# Patient Record
Sex: Female | Born: 1975
Health system: Southern US, Community
[De-identification: ages and names within clinical notes are randomized; demographics above are authoritative.]

## PROBLEM LIST (undated history)

## (undated) ENCOUNTER — Emergency Department (HOSPITAL_COMMUNITY): Admission: EM | Payer: Self-pay

## (undated) DIAGNOSIS — E039 Hypothyroidism, unspecified: Secondary | ICD-10-CM

## (undated) DIAGNOSIS — I10 Essential (primary) hypertension: Secondary | ICD-10-CM

## (undated) DIAGNOSIS — E079 Disorder of thyroid, unspecified: Secondary | ICD-10-CM

## (undated) DIAGNOSIS — Z9119 Patient's noncompliance with other medical treatment and regimen: Secondary | ICD-10-CM

## (undated) DIAGNOSIS — I509 Heart failure, unspecified: Secondary | ICD-10-CM

## (undated) HISTORY — DX: Essential (primary) hypertension: I10

## (undated) HISTORY — PX: TUBAL LIGATION: SHX77

## (undated) HISTORY — DX: Patient's noncompliance with other medical treatment and regimen: Z91.19

---

## 2013-03-01 ENCOUNTER — Encounter (HOSPITAL_BASED_OUTPATIENT_CLINIC_OR_DEPARTMENT_OTHER): Payer: Self-pay | Admitting: Emergency Medicine

## 2013-03-01 ENCOUNTER — Emergency Department (HOSPITAL_BASED_OUTPATIENT_CLINIC_OR_DEPARTMENT_OTHER)
Admission: EM | Admit: 2013-03-01 | Discharge: 2013-03-01 | Disposition: A | Discharge status: Home / Self Care | Payer: Self-pay | Attending: Emergency Medicine | Admitting: Emergency Medicine

## 2013-03-01 DIAGNOSIS — Z8639 Personal history of other endocrine, nutritional and metabolic disease: Secondary | ICD-10-CM | POA: Insufficient documentation

## 2013-03-01 DIAGNOSIS — I10 Essential (primary) hypertension: Secondary | ICD-10-CM | POA: Insufficient documentation

## 2013-03-01 DIAGNOSIS — Z862 Personal history of diseases of the blood and blood-forming organs and certain disorders involving the immune mechanism: Secondary | ICD-10-CM | POA: Insufficient documentation

## 2013-03-01 DIAGNOSIS — B86 Scabies: Secondary | ICD-10-CM | POA: Insufficient documentation

## 2013-03-01 HISTORY — DX: Essential (primary) hypertension: I10

## 2013-03-01 HISTORY — DX: Disorder of thyroid, unspecified: E07.9

## 2013-03-01 HISTORY — DX: Hypothyroidism, unspecified: E03.9

## 2013-03-01 MED ORDER — PERMETHRIN 5 % EX CREA: TOPICAL_CREAM | CUTANEOUS | Status: DC

## 2013-03-01 NOTE — ED Provider Notes (Signed)
CSN: [id number]     Arrival date & time 03/01/13  4098 History   First MD Initiated Contact with Patient 03/01/13 0827     Chief Complaint  Patient presents with  . Rash   (Consider location/radiation/quality/duration/timing/severity/associated sxs/prior Treatment) Patient is a 37 y.o. female presenting with rash. The history is provided by the patient.  Rash Location:  Hand, shoulder/arm and leg Shoulder/arm rash location:  L hand Hand rash location:  L hand Leg rash location:  L upper leg Quality: itchiness   Quality: not bruising, not burning, not peeling, not red, not scaling, not swelling and not weeping   Severity:  Mild Onset quality:  Gradual Duration:  3 weeks Timing:  Constant Progression:  Spreading Chronicity:  New Context: sick contacts (had sex with a new partner who had similar symptoms)   Context: not medications and not new detergent/soap   Relieved by:  Nothing Worsened by:  Nothing tried Ineffective treatments:  Antihistamines Associated symptoms: no abdominal pain, no diarrhea, no fever, no nausea, no shortness of breath, no URI and not vomiting     Past Medical History  Diagnosis Date  . Hypertension   . Thyroid disease   . Hypothyroidism    Past Surgical History  Procedure Laterality Date  . Cesarean section      x 2  . Tubal ligation     No family history on file. History  Substance Use Topics  . Smoking status: Never Smoker   . Smokeless tobacco: Never Used  . Alcohol Use: No   OB History   Grav Para Term Preterm Abortions TAB SAB Ect Mult Living                 Review of Systems  Constitutional: Negative for fever.  Respiratory: Negative for shortness of breath.   Gastrointestinal: Negative for nausea, vomiting, abdominal pain and diarrhea.  Skin: Positive for rash.  All other systems reviewed and are negative.    Allergies  Review of patient's allergies indicates no known allergies.  Home Medications   Current Outpatient  Rx  Name  Route  Sig  Dispense  Refill  . permethrin (ELIMITE) 5 % cream      Apply to affected area once   60 g   1    BP 147/85  Temp(Src) 99 F (37.2 C) (Oral)  Resp 20  Ht 5\' 6"  (1.676 m)  Wt 206 lb (93.441 kg)  BMI 33.27 kg/m2  SpO2 100%  LMP 02/13/2013 Physical Exam  Nursing note and vitals reviewed. Constitutional: She is oriented to person, place, and time. She appears well-developed and well-nourished. No distress.  HENT:  Head: Normocephalic and atraumatic.  Mouth/Throat: Oropharynx is clear and moist.  Eyes: EOM are normal. Pupils are equal, round, and reactive to light.  Neck: Normal range of motion. Neck supple.  Cardiovascular: Normal rate and regular rhythm.  Exam reveals no friction rub.   No murmur heard. Pulmonary/Chest: Effort normal and breath sounds normal. No respiratory distress. She has no wheezes. She has no rales.  Abdominal: Soft. She exhibits no distension. There is no tenderness. There is no rebound.  Musculoskeletal: Normal range of motion. She exhibits no edema.  Neurological: She is alert and oriented to person, place, and time.  Skin: Rash (papules on L hand, L chest, upper back, face. 1-2 at each location. No purpura, erythema, cellulitis, weeping, blistering) noted. She is not diaphoretic.    ED Course  Procedures (including critical care time) Labs Review  Labs Reviewed - No data to display Imaging Review No results found.  EKG Interpretation   None       MDM   1. Scabies    37 year old female presents with rash. She's had itching with the rash. The rash is on her left hand. She also has lesions on her chest, upper back, face, left thigh. She is with a partner 3-4 weeks ago which was new and he had similar rash was itching at that time. She denies any fevers, nausea, vomiting, discharge, belly pain. She is very small papules on her left hand underwent . She also has one or 2 tablets in the left chest: Reveals her face, one or 2  papules on her left thigh. There is erythema, purpura, concern for infection. Patient's rash consistent with scabies. Given permethrin cream. Instructed to longer her close and sheets. Discharged.    Dagmar Hait, MD 03/01/13 [phone]

## 2013-03-01 NOTE — ED Notes (Signed)
Patient states she has had a generalized body rash for the last three weeks.  States she was told by someone this morning she had chicken pox.  States the rash starts as an itch and migrates up.

## 2017-07-28 ENCOUNTER — Emergency Department (HOSPITAL_BASED_OUTPATIENT_CLINIC_OR_DEPARTMENT_OTHER): Payer: Self-pay

## 2017-07-28 ENCOUNTER — Inpatient Hospital Stay (HOSPITAL_BASED_OUTPATIENT_CLINIC_OR_DEPARTMENT_OTHER)
Admission: EM | Admit: 2017-07-28 | Discharge: 2017-07-30 | DRG: 305 | Disposition: A | Discharge status: Home / Self Care | Payer: Self-pay | Attending: Internal Medicine | Admitting: Internal Medicine

## 2017-07-28 ENCOUNTER — Other Ambulatory Visit: Payer: Self-pay

## 2017-07-28 ENCOUNTER — Encounter (HOSPITAL_BASED_OUTPATIENT_CLINIC_OR_DEPARTMENT_OTHER): Payer: Self-pay | Admitting: *Deleted

## 2017-07-28 DIAGNOSIS — R030 Elevated blood-pressure reading, without diagnosis of hypertension: Secondary | ICD-10-CM

## 2017-07-28 DIAGNOSIS — R0602 Shortness of breath: Secondary | ICD-10-CM

## 2017-07-28 DIAGNOSIS — I110 Hypertensive heart disease with heart failure: Secondary | ICD-10-CM | POA: Diagnosis present

## 2017-07-28 DIAGNOSIS — G4700 Insomnia, unspecified: Secondary | ICD-10-CM | POA: Diagnosis present

## 2017-07-28 DIAGNOSIS — D649 Anemia, unspecified: Secondary | ICD-10-CM

## 2017-07-28 DIAGNOSIS — R7989 Other specified abnormal findings of blood chemistry: Secondary | ICD-10-CM

## 2017-07-28 DIAGNOSIS — K5900 Constipation, unspecified: Secondary | ICD-10-CM | POA: Diagnosis present

## 2017-07-28 DIAGNOSIS — G44209 Tension-type headache, unspecified, not intractable: Secondary | ICD-10-CM | POA: Diagnosis present

## 2017-07-28 DIAGNOSIS — D509 Iron deficiency anemia, unspecified: Secondary | ICD-10-CM

## 2017-07-28 DIAGNOSIS — I160 Hypertensive urgency: Principal | ICD-10-CM | POA: Diagnosis present

## 2017-07-28 DIAGNOSIS — E039 Hypothyroidism, unspecified: Secondary | ICD-10-CM

## 2017-07-28 DIAGNOSIS — I5032 Chronic diastolic (congestive) heart failure: Secondary | ICD-10-CM

## 2017-07-28 LAB — CBC
HEMATOCRIT: 27.4 % — AB (ref 36.0–46.0)
Hemoglobin: 7.7 g/dL — ABNORMAL LOW (ref 12.0–15.0)
MCH: 17.1 pg — AB (ref 26.0–34.0)
MCHC: 28.1 g/dL — AB (ref 30.0–36.0)
MCV: 61 fL — AB (ref 78.0–100.0)
Platelets: 187 10*3/uL (ref 150–400)
RBC: 4.49 MIL/uL (ref 3.87–5.11)
RDW: 21.3 % — AB (ref 11.5–15.5)
WBC: 5.5 10*3/uL (ref 4.0–10.5)

## 2017-07-28 LAB — COMPREHENSIVE METABOLIC PANEL
ALBUMIN: 4.2 g/dL (ref 3.5–5.0)
ALK PHOS: 111 U/L (ref 38–126)
ALT: 12 U/L — ABNORMAL LOW (ref 14–54)
AST: 17 U/L (ref 15–41)
Anion gap: 11 (ref 5–15)
BUN: 13 mg/dL (ref 6–20)
CALCIUM: 8.8 mg/dL — AB (ref 8.9–10.3)
CO2: 23 mmol/L (ref 22–32)
Chloride: 102 mmol/L (ref 101–111)
Creatinine, Ser: 0.6 mg/dL (ref 0.44–1.00)
GFR calc Af Amer: >60 mL/min (ref >60)
GFR calc non Af Amer: >60 mL/min (ref >60)
Glucose, Bld: 99 mg/dL (ref 65–99)
POTASSIUM: 3.5 mmol/L (ref 3.5–5.1)
Sodium: 136 mmol/L (ref 135–145)
Total Bilirubin: 0.5 mg/dL (ref 0.3–1.2)
Total Protein: 7.8 g/dL (ref 6.5–8.1)

## 2017-07-28 LAB — CBC WITH DIFFERENTIAL/PLATELET
Basophils Absolute: 0 10*3/uL (ref 0.0–0.1)
Basophils Relative: 0 %
EOS PCT: 3 %
Eosinophils Absolute: 0.1 10*3/uL (ref 0.0–0.7)
HCT: 28.8 % — ABNORMAL LOW (ref 36.0–46.0)
HEMOGLOBIN: 8.1 g/dL — AB (ref 12.0–15.0)
Lymphocytes Relative: 28 %
Lymphs Abs: 1.2 10*3/uL (ref 0.7–4.0)
MCH: 17.2 pg — ABNORMAL LOW (ref 26.0–34.0)
MCHC: 28.1 g/dL — ABNORMAL LOW (ref 30.0–36.0)
MCV: 61 fL — AB (ref 78.0–100.0)
Monocytes Absolute: 0.4 10*3/uL (ref 0.1–1.0)
Monocytes Relative: 9 %
NEUTROS PCT: 60 %
Neutro Abs: 2.7 10*3/uL (ref 1.7–7.7)
Platelets: 178 10*3/uL (ref 150–400)
RBC: 4.72 MIL/uL (ref 3.87–5.11)
RDW: 21 % — ABNORMAL HIGH (ref 11.5–15.5)
WBC: 4.4 10*3/uL (ref 4.0–10.5)

## 2017-07-28 LAB — URINALYSIS, ROUTINE W REFLEX MICROSCOPIC
BILIRUBIN URINE: NEGATIVE
Glucose, UA: NEGATIVE mg/dL
Hgb urine dipstick: NEGATIVE
Ketones, ur: NEGATIVE mg/dL
Leukocytes, UA: NEGATIVE
NITRITE: NEGATIVE
PH: 6 (ref 5.0–8.0)
Protein, ur: NEGATIVE mg/dL
SPECIFIC GRAVITY, URINE: 1.02 (ref 1.005–1.030)

## 2017-07-28 LAB — LIPASE, BLOOD: Lipase: 23 U/L (ref 11–51)

## 2017-07-28 LAB — CREATININE, SERUM
Creatinine, Ser: 0.65 mg/dL (ref 0.44–1.00)
GFR calc Af Amer: >60 mL/min (ref >60)
GFR calc non Af Amer: >60 mL/min (ref >60)

## 2017-07-28 LAB — OCCULT BLOOD X 1 CARD TO LAB, STOOL: Fecal Occult Bld: NEGATIVE

## 2017-07-28 LAB — TROPONIN I: Troponin I: <0.03 ng/mL (ref <0.03)

## 2017-07-28 LAB — BRAIN NATRIURETIC PEPTIDE: B Natriuretic Peptide: 238.5 pg/mL — ABNORMAL HIGH (ref 0.0–100.0)

## 2017-07-28 MED ORDER — IRBESARTAN 75 MG PO TABS
75.0000 mg | ORAL_TABLET | Freq: Every day | ORAL | Status: DC
  Administered 2017-07-30: 75 mg via ORAL
  Filled 2017-07-28 (×2): qty 1

## 2017-07-28 MED ORDER — LABETALOL HCL 5 MG/ML IV SOLN
10.0000 mg | Freq: Once | INTRAVENOUS | Status: AC
  Administered 2017-07-28: 10 mg via INTRAVENOUS
  Filled 2017-07-28: qty 4

## 2017-07-28 MED ORDER — SODIUM CHLORIDE 0.9 % IV SOLN: 250.0000 mL | INTRAVENOUS | Status: DC | PRN

## 2017-07-28 MED ORDER — CARVEDILOL 6.25 MG PO TABS
6.2500 mg | ORAL_TABLET | Freq: Two times a day (BID) | ORAL | Status: DC
  Administered 2017-07-29 – 2017-07-30 (×2): 6.25 mg via ORAL
  Filled 2017-07-28 (×3): qty 1

## 2017-07-28 MED ORDER — SODIUM CHLORIDE 0.9% FLUSH
3.0000 mL | Freq: Two times a day (BID) | INTRAVENOUS | Status: DC
  Administered 2017-07-29 – 2017-07-30 (×2): 3 mL via INTRAVENOUS
  Filled 2017-07-28: qty 3

## 2017-07-28 MED ORDER — ZOLPIDEM TARTRATE 5 MG PO TABS
5.0000 mg | ORAL_TABLET | Freq: Every evening | ORAL | Status: DC | PRN
  Filled 2017-07-28: qty 1

## 2017-07-28 MED ORDER — IPRATROPIUM-ALBUTEROL 0.5-2.5 (3) MG/3ML IN SOLN
3.0000 mL | Freq: Four times a day (QID) | RESPIRATORY_TRACT | Status: DC
  Administered 2017-07-28 (×2): 3 mL via RESPIRATORY_TRACT
  Filled 2017-07-28 (×2): qty 3

## 2017-07-28 MED ORDER — ACETAMINOPHEN 325 MG PO TABS
650.0000 mg | ORAL_TABLET | ORAL | Status: DC | PRN
  Administered 2017-07-29 – 2017-07-30 (×3): 650 mg via ORAL
  Filled 2017-07-28 (×3): qty 2

## 2017-07-28 MED ORDER — ONDANSETRON HCL 4 MG/2ML IJ SOLN
4.0000 mg | Freq: Four times a day (QID) | INTRAMUSCULAR | Status: DC | PRN
  Administered 2017-07-29: 4 mg via INTRAVENOUS
  Filled 2017-07-28: qty 2

## 2017-07-28 MED ORDER — ENOXAPARIN SODIUM 40 MG/0.4ML ~~LOC~~ SOLN
40.0000 mg | SUBCUTANEOUS | Status: DC
  Administered 2017-07-29: 40 mg via SUBCUTANEOUS
  Filled 2017-07-28: qty 0.4

## 2017-07-28 MED ORDER — SODIUM CHLORIDE 0.9% FLUSH
3.0000 mL | INTRAVENOUS | Status: DC | PRN
  Filled 2017-07-28: qty 3

## 2017-07-28 NOTE — ED Triage Notes (Signed)
Cough that is worse at night and fever x 9 days. She feels like she has the flu.

## 2017-07-28 NOTE — Care Management (Addendum)
42 year old female [facility]Dr. Rush Landmarkegeler) regarding possible new congestive heart failure.  Patient presented there complaining of the right upper respiratory infection shortness of breath and a cough for about a week.  She has a long history of heavy menstrual bleeding but has not had a.  For the past 10 days.  At presentation her blood pressure was 230/110 she was given some labetalol.  She has no prior history of congestive heart failure but a chest x-ray shows chronic cardiomegaly.  She has some mild edema peripherally on examination.  On presentation she was also wheezing and was treated for that with nebulizers and improved.  Review of care everywhere reveals that in 2008 she received 2 units of packed red blood cells after delivery of a baby.  At that point her hemoglobin was 8.1.  However her MCV was 79.  Today her MCV is 61.  Her blood pressure is markedly elevated and out of control.  She will be admitted for hypertensive emergency with congestive heart failure.  Patient reports that she has never been on any medications but review of records in care everywhere reveals that she was prescribed hydrochlorothiazide on January 30, 2016. Blood pressures at ER visits in October 26, 2016 (213/115), January 30, 2016 (190/91), and September 22, 2014 (184/97) all revealed markedly elevated blood pressures as noted in parentheses.  Concerning issues for this patient are:  1. hypertensive emergency with acute congestive heart failure 2.  Anemia with hemoglobin of 8.1 and MCV of 61 3.  Bronchospasm responsive to nebulizers in a non-smoker.  She will be admitted to our facility. Initial holding admission orders are written.

## 2017-07-28 NOTE — ED Provider Notes (Signed)
[facility] EMERGENCY DEPARTMENT Provider Note   CSN: [id number] Arrival date & time: 07/28/17  1532     History   Chief Complaint Chief Complaint  Patient presents with  . Cough    HPI Andrea Barrett is a 42 y.o. female.  The history is provided by the patient and medical records. No language interpreter was used.  Cough  This is a new problem. The current episode started more than 1 week ago. The problem occurs constantly. The problem has been gradually worsening. The cough is productive of sputum. Maximum temperature: subjective. Associated symptoms include chest pain, chills, headaches (mild), rhinorrhea, myalgias, shortness of breath and wheezing. Pertinent negatives include no sweats and no sore throat. She has tried nothing for the symptoms. The treatment provided no relief. She is not a smoker.    Past Medical History:  Diagnosis Date  . Hypertension   . Hypothyroidism   . Thyroid disease     There are no active problems to display for this patient.   Past Surgical History:  Procedure Laterality Date  . CESAREAN SECTION     x 2  . TUBAL LIGATION       OB History   None      Home Medications    Prior to Admission medications   Medication Sig Start Date End Date Taking? Authorizing Provider  permethrin (ELIMITE) 5 % cream Apply to affected area once 03/01/13   Elwin Mocha, MD    Family History No family history on file.  Social History Social History   Tobacco Use  . Smoking status: Never Smoker  . Smokeless tobacco: Never Used  Substance Use Topics  . Alcohol use: No  . Drug use: No     Allergies   Patient has no known allergies.   Review of Systems Review of Systems  Constitutional: Positive for chills, fatigue and fever. Negative for diaphoresis and unexpected weight change.  HENT: Positive for congestion and rhinorrhea. Negative for sore throat.   Eyes: Negative for visual disturbance.  Respiratory: Positive for  cough, chest tightness, shortness of breath and wheezing. Negative for choking and stridor.   Cardiovascular: Positive for chest pain. Negative for palpitations and leg swelling.  Gastrointestinal: Positive for constipation, nausea and vomiting. Negative for abdominal pain and diarrhea.  Genitourinary: Positive for frequency. Negative for dysuria.  Musculoskeletal: Positive for myalgias. Negative for back pain, neck pain and neck stiffness.  Skin: Negative for rash and wound.  Neurological: Positive for headaches (mild). Negative for light-headedness.  Psychiatric/Behavioral: Negative for agitation.  All other systems reviewed and are negative.    Physical Exam Updated Vital Signs BP (!) 221/116 Comment: only takes her BP medication when she feels her BP is elevated  Pulse 91   Temp 98.3 F (36.8 C) (Oral)   Resp 20   Ht 5\' 6"  (1.676 m)   Wt 117 kg (257 lb 15 oz)   LMP 07/21/2017   SpO2 99%   BMI 41.63 kg/m   Physical Exam  Constitutional: She is oriented to person, place, and time. She appears well-developed and well-nourished. No distress.  HENT:  Head: Normocephalic and atraumatic.  Nose: Nose normal.  Mouth/Throat: Oropharynx is clear and moist. No oropharyngeal exudate.  Eyes: Pupils are equal, round, and reactive to light. Conjunctivae are normal.  Neck: Normal range of motion. Neck supple.  Cardiovascular: Normal rate and intact distal pulses.  No murmur heard. Pulmonary/Chest: Effort normal. No respiratory distress. She has wheezes. She exhibits  no tenderness.  Abdominal: Soft. Bowel sounds are normal. She exhibits no distension. There is no tenderness.  Musculoskeletal: Normal range of motion. She exhibits no edema or tenderness.  Lymphadenopathy:    She has no cervical adenopathy.  Neurological: She is alert and oriented to person, place, and time. No cranial nerve deficit or sensory deficit. She exhibits normal muscle tone.  Skin: Skin is warm. Capillary refill  takes less than 2 seconds. No rash noted. She is not diaphoretic. No erythema.  Psychiatric: She has a normal mood and affect.  Nursing note and vitals reviewed.    ED Treatments / Results  Labs (all labs ordered are listed, but only abnormal results are displayed) Labs Reviewed  COMPREHENSIVE METABOLIC PANEL - Abnormal; Notable for the following components:      Result Value   Calcium 8.8 (*)    ALT 12 (*)    All other components within normal limits  BRAIN NATRIURETIC PEPTIDE - Abnormal; Notable for the following components:   B Natriuretic Peptide 238.5 (*)    All other components within normal limits  CBC WITH DIFFERENTIAL/PLATELET - Abnormal; Notable for the following components:   Hemoglobin 8.1 (*)    HCT 28.8 (*)    MCV 61.0 (*)    MCH 17.2 (*)    MCHC 28.1 (*)    RDW 21.0 (*)    All other components within normal limits  URINE CULTURE  LIPASE, BLOOD  TROPONIN I  URINALYSIS, ROUTINE W REFLEX MICROSCOPIC  OCCULT BLOOD X 1 CARD TO LAB, STOOL  CREATININE, SERUM  CBC WITH DIFFERENTIAL/PLATELET  HIV ANTIBODY (ROUTINE TESTING)  BASIC METABOLIC PANEL  CBC  TSH    EKG EKG Interpretation  Date/Time:  Tuesday July 28 2017 16:29:23 EDT Ventricular Rate:  67 PR Interval:    QRS Duration: 98 QT Interval:  446 QTC Calculation: 471 R Axis:   63 Text Interpretation:  Sinus rhythm Probable left ventricular hypertrophy No prior ECG for comparison.  No STEMI Confirmed by Theda Belfastegeler, Chris ([id number]) on 07/28/2017 4:43:59 PM   Radiology Dg Chest 2 View  Result Date: 07/28/2017 CLINICAL DATA:  Productive cough with chills and malaise EXAM: CHEST - 2 VIEW COMPARISON:  01/26/2007 FINDINGS: Chronic cardiomegaly. Chronic vascular prominence. Artifact from EKG leads. There is no edema, consolidation, effusion, or pneumothorax. No acute osseous finding. IMPRESSION: 1. No evidence of active disease. 2. Chronic cardiomegaly. Electronically Signed   By: Marnee SpringJonathon  Watts M.D.   On:  07/28/2017 17:03    Procedures Procedures (including critical care time)  Medications Ordered in ED Medications  sodium chloride flush (NS) 0.9 % injection 3 mL (has no administration in time range)  sodium chloride flush (NS) 0.9 % injection 3 mL (has no administration in time range)  0.9 %  sodium chloride infusion (has no administration in time range)  acetaminophen (TYLENOL) tablet 650 mg (has no administration in time range)  ondansetron (ZOFRAN) injection 4 mg (has no administration in time range)  enoxaparin (LOVENOX) injection 40 mg (has no administration in time range)  irbesartan (AVAPRO) tablet 75 mg (has no administration in time range)  carvedilol (COREG) tablet 6.25 mg (has no administration in time range)  zolpidem (AMBIEN) tablet 5 mg (has no administration in time range)  labetalol (NORMODYNE,TRANDATE) injection 10 mg (10 mg Intravenous Given 07/28/17 2014)     Initial Impression / Assessment and Plan / ED Course  I have reviewed the triage vital signs and the nursing notes.  Pertinent labs & imaging  results that were available during my care of the patient were reviewed by me and considered in my medical decision making (see chart for details).    EMMI WERTHEIM is a 42 y.o. female with a past medical history significant for diabetes, hyperthyroidism, and on treated hypertension who presents with fevers, chills, congestion, cough, chest tightness, malaise, fatigue, urinary frequency, nausea, and chest tightness.  Patient reports that her symptoms of been ongoing for the last 9 days.  She reports over the last week she has had the cough persistence.  She reports having an initially green and yellow cough that has become nonproductive over the last few days.  She denies hemoptysis.  She reports nausea but no vomiting.  She reports urinary frequency but denies dysuria.  She denies any abdominal pain.  She reports chills on and off.  She denies any history of DVT, PE, or a  family history of cardiac disease.  She reports that she has had some tingling in all of her extremities.  She reports that she has had some shortness of breath with her coughing fits and is also had the sharp chest discomfort when she is coughing.  She is unsure of sick contacts.  On exam, patient had wheezing in her lungs.  Chest was nontender.  Back was nontender.  No CVA tenderness.  Abdomen nontender.  Patient had normal sensation and strength in all extremities.  Normal pulses in all extremities.  Very mild edema seen in the lower extremities.  Based on her description of symptoms I am concerned about a viral versus bacterial infection contributing to her symptoms.  Patient was found to be very albuterol hypertensive in triage, will place the patient on monitoring to determine if she is persistently hypertensive.  If so, may consider an hypertensive medications.  Given the patient's wheezing, she also be given a breathing treatment.  If this improves and she feels better, will consider prescription.  Anticipate reassessment after diagnostic laboratory testing.  As patient no focal neurologic deficits, do not feel the patient has an intracranial hemorrhage or other abnormality in the setting of her elevated blood pressure.  8:02 PM On reassessment, patient had improvement after breathing treatment.  Patient continues to feel slightly short of breath.  Wheezing is resolved.  Patient's blood pressure initially improved into the 190s but then was back into the 210 range.  Laboratory testing revealed several areas of concern.  Patient was found to have elevated BNP.  There is no prior to compare.  Patient also had stable cardiomegaly on chest x-ray.  This raises concern for new heart failure.  Patient also found to have anemia with hemoglobin of 8.1.  Patient says that she has had a history of heavy menstrual cycles and finished her menstrual cycle 10 days ago.  She reports no recent vaginal bleeding  since then.  She denies any rectal bleeding.  Fecal occult was negative.  Metabolic panel was reassuring and lipase not elevated.  Urinalysis showed no UTI.  Chest x-ray shows no pneumonia.  Patient was given labetalol to try and improve her elevated blood pressure.  Given the patient's continued shortness of breath, new anemia with unclear etiology, concern for elevated BNP with no prior history of CHF, and her severe blood pressure elevation, do not feel patient is safe for discharge home.  Patient will be admitted for further management of her elevated blood pressure, monitoring of her anemia, and likely initiation of diuresis.  Patient will likely need ultrasound to  look for evidence of heart failure.  Hospitalist team will be called for admission.     Final Clinical Impressions(s) / ED Diagnoses   Final diagnoses:  Shortness of breath  Elevated brain natriuretic peptide (BNP) level  Anemia, unspecified type  Elevated blood pressure reading    ED Discharge Orders    None      Clinical Impression: 1. Shortness of breath   2. Elevated brain natriuretic peptide (BNP) level   3. Anemia, unspecified type   4. Elevated blood pressure reading     Disposition: Admit  This note was prepared with assistance of Dragon voice recognition software. Occasional wrong-word or sound-a-like substitutions may have occurred due to the inherent limitations of voice recognition software.     Tegeler, Canary Brim, MD 07/28/17 [phone]

## 2017-07-29 ENCOUNTER — Inpatient Hospital Stay (HOSPITAL_BASED_OUTPATIENT_CLINIC_OR_DEPARTMENT_OTHER): Payer: Self-pay

## 2017-07-29 ENCOUNTER — Encounter (HOSPITAL_COMMUNITY): Payer: Self-pay | Admitting: Family Medicine

## 2017-07-29 DIAGNOSIS — I110 Hypertensive heart disease with heart failure: Secondary | ICD-10-CM

## 2017-07-29 DIAGNOSIS — I517 Cardiomegaly: Secondary | ICD-10-CM

## 2017-07-29 LAB — CBC
HCT: 28.9 % — ABNORMAL LOW (ref 36.0–46.0)
Hemoglobin: 7.6 g/dL — ABNORMAL LOW (ref 12.0–15.0)
MCH: 16.6 pg — AB (ref 26.0–34.0)
MCHC: 26.3 g/dL — AB (ref 30.0–36.0)
MCV: 63 fL — AB (ref 78.0–100.0)
Platelets: 167 10*3/uL (ref 150–400)
RBC: 4.59 MIL/uL (ref 3.87–5.11)
RDW: 20 % — AB (ref 11.5–15.5)
WBC: 4.6 10*3/uL (ref 4.0–10.5)

## 2017-07-29 LAB — IRON AND TIBC
Iron: 18 ug/dL — ABNORMAL LOW (ref 28–170)
SATURATION RATIOS: 4 % — AB (ref 10.4–31.8)
TIBC: 482 ug/dL — AB (ref 250–450)
UIBC: 464 ug/dL

## 2017-07-29 LAB — RETICULOCYTES
RBC.: 4.59 MIL/uL (ref 3.87–5.11)
RETIC CT PCT: 1.5 % (ref 0.4–3.1)
Retic Count, Absolute: 68.9 10*3/uL (ref 19.0–186.0)

## 2017-07-29 LAB — CREATININE, SERUM
CREATININE: 0.65 mg/dL (ref 0.44–1.00)
GFR calc Af Amer: >60 mL/min (ref >60)

## 2017-07-29 LAB — FOLATE: Folate: 11.4 ng/mL (ref >5.9)

## 2017-07-29 LAB — ECHOCARDIOGRAM COMPLETE
HEIGHTINCHES: 66 in
WEIGHTICAEL: 4127.01 [oz_av]

## 2017-07-29 LAB — FERRITIN: FERRITIN: 5 ng/mL — AB (ref 11–307)

## 2017-07-29 LAB — TSH: TSH: 4.717 u[IU]/mL — AB (ref 0.350–4.500)

## 2017-07-29 LAB — VITAMIN B12: VITAMIN B 12: 401 pg/mL (ref 180–914)

## 2017-07-29 LAB — TROPONIN I: Troponin I: <0.03 ng/mL (ref <0.03)

## 2017-07-29 MED ORDER — LABETALOL HCL 5 MG/ML IV SOLN
5.0000 mg | Freq: Once | INTRAVENOUS | Status: AC
  Administered 2017-07-29: 5 mg via INTRAVENOUS
  Filled 2017-07-29: qty 4

## 2017-07-29 MED ORDER — SODIUM CHLORIDE 0.9% FLUSH
3.0000 mL | Freq: Two times a day (BID) | INTRAVENOUS | Status: DC
  Administered 2017-07-29 – 2017-07-30 (×3): 3 mL via INTRAVENOUS

## 2017-07-29 MED ORDER — ONDANSETRON HCL 4 MG/2ML IJ SOLN: 4.0000 mg | Freq: Four times a day (QID) | INTRAMUSCULAR | Status: DC | PRN

## 2017-07-29 MED ORDER — LISINOPRIL 5 MG PO TABS: 5.0000 mg | ORAL_TABLET | Freq: Every day | ORAL | Status: DC

## 2017-07-29 MED ORDER — SODIUM CHLORIDE 0.9 % IV SOLN: 250.0000 mL | INTRAVENOUS | Status: DC | PRN

## 2017-07-29 MED ORDER — POLYETHYLENE GLYCOL 3350 17 G PO PACK
17.0000 g | PACK | Freq: Every day | ORAL | Status: DC
  Administered 2017-07-29 – 2017-07-30 (×2): 17 g via ORAL
  Filled 2017-07-29 (×2): qty 1

## 2017-07-29 MED ORDER — DOCUSATE SODIUM 100 MG PO CAPS: 100.0000 mg | ORAL_CAPSULE | Freq: Every day | ORAL | Status: DC | PRN

## 2017-07-29 MED ORDER — BENZONATATE 100 MG PO CAPS
100.0000 mg | ORAL_CAPSULE | Freq: Once | ORAL | Status: AC
  Administered 2017-07-29: 100 mg via ORAL
  Filled 2017-07-29: qty 1

## 2017-07-29 MED ORDER — SODIUM CHLORIDE 0.9% FLUSH: 3.0000 mL | INTRAVENOUS | Status: DC | PRN

## 2017-07-29 MED ORDER — TRAMADOL HCL 50 MG PO TABS
50.0000 mg | ORAL_TABLET | Freq: Once | ORAL | Status: AC
Start: 2017-07-30 — End: 2017-07-30
  Administered 2017-07-30: 50 mg via ORAL
  Filled 2017-07-29: qty 1

## 2017-07-29 MED ORDER — HEPARIN SODIUM (PORCINE) 5000 UNIT/ML IJ SOLN: 5000.0000 [IU] | Freq: Three times a day (TID) | INTRAMUSCULAR | Status: DC

## 2017-07-29 MED ORDER — HYDRALAZINE HCL 20 MG/ML IJ SOLN
10.0000 mg | Freq: Three times a day (TID) | INTRAMUSCULAR | Status: DC | PRN
  Administered 2017-07-29: 10 mg via INTRAVENOUS
  Filled 2017-07-29: qty 1

## 2017-07-29 MED ORDER — FUROSEMIDE 10 MG/ML IJ SOLN
20.0000 mg | Freq: Once | INTRAMUSCULAR | Status: AC
  Administered 2017-07-29: 20 mg via INTRAVENOUS
  Filled 2017-07-29: qty 2

## 2017-07-29 MED ORDER — ACETAMINOPHEN 325 MG PO TABS: 650.0000 mg | ORAL_TABLET | ORAL | Status: DC | PRN

## 2017-07-29 MED ORDER — HYDROCOD POLST-CPM POLST ER 10-8 MG/5ML PO SUER
5.0000 mL | Freq: Once | ORAL | Status: AC
Start: 2017-07-29 — End: 2017-07-29
  Administered 2017-07-29: 5 mL via ORAL
  Filled 2017-07-29: qty 5

## 2017-07-29 NOTE — Progress Notes (Signed)
Echocardiogram 2D Echocardiogram has been performed.  Dorothey BasemanReel, Braxton Weisbecker M 07/29/2017, 10:29 AM

## 2017-07-29 NOTE — ED Notes (Signed)
Patient reports relief from coughing after tessalon.

## 2017-07-29 NOTE — Progress Notes (Signed)
Patient does not want type and screen drawn now. Lab tech had just drawn troponin before receiving order for type and screen. RN told lab tech it would be ok to get type and screen with next troponin as patient is refusing lab draw at this time.

## 2017-07-29 NOTE — ED Notes (Signed)
Pt still asleep 

## 2017-07-29 NOTE — H&P (Addendum)
[facility] History and Physical  Andrea Barrett ZOX:[id number] DOB: 04/16/1975 DOA: 07/28/2017  Referring physician:  PCP: Patient, No Pcp Per   Chief Complaint: cough  HPI: Andrea Barrett is a 42 y.o. female with past medical history significant for hypertension, hypothyroid presents with cough.  Patient states that she had a cough for over 7 days.  No chest pain or shortness of breath.  She states that she did not try any over-the-counter medications.  Denies any sick contacts.  No history of heart failure.  No history of heart attack.  ED course: Patient found to be very hypertensive.  Also felt to have some mild interstitial edema on chest x-ray.  Given breathing treatments.  Hospitalist to admit.   Review of Systems:  As per HPI otherwise 10 point review of systems negative.    Past Medical History:  Diagnosis Date  . Hypertension   . Hypothyroidism   . Thyroid disease    Past Surgical History:  Procedure Laterality Date  . CESAREAN SECTION     x 2  . TUBAL LIGATION     Social History:  reports that she has never smoked. She has never used smokeless tobacco. She reports that she does not drink alcohol or use drugs.  No Known Allergies  No family history on file.   Prior to Admission medications   Medication Sig Start Date End Date Taking? Authorizing Provider  permethrin (ELIMITE) 5 % cream Apply to affected area once 03/01/13   Elwin MochaWalden, Blair, MD   Physical Exam: Vitals:   07/29/17 1128 07/29/17 1130 07/29/17 1145 07/29/17 1259  BP: (!) 187/91 (!) 186/100 (!) 195/90 (!) 217/103  Pulse: 69 60  67  Resp:  16 18 18   Temp:    98.6 F (37 C)  TempSrc:    Oral  SpO2:  97% 99% 98%  Weight:    115.2 kg (253 lb 14.4 oz)  Height:    5\' 6"  (1.676 m)    Wt Readings from Last 3 Encounters:  07/29/17 115.2 kg (253 lb 14.4 oz)  03/01/13 93.4 kg (206 lb)    General:  Appears calm and comfortable; A&Ox3 Eyes:  PERRL, EOMI, normal lids, iris ENT:  grossly  normal hearing, lips & tongue Neck:  no LAD, masses or thyromegaly Cardiovascular:  RRR, no m/r/g. No LE edema.  Respiratory:  CTA bilaterally, no w/r/r. Normal respiratory effort. Abdomen:  soft, ntnd Skin:  no rash or induration seen on limited exam Musculoskeletal:  grossly normal tone BUE/BLE Psychiatric:  grossly normal mood and affect, speech fluent and appropriate Neurologic:  CN 2-12 grossly intact, moves all extremities in coordinated fashion.          Labs on Admission:  Basic Metabolic Panel: Recent Labs  Lab 07/28/17 1619 07/28/17 2244  NA 136  --   K 3.5  --   CL 102  --   CO2 23  --   GLUCOSE 99  --   BUN 13  --   CREATININE 0.60 0.65  CALCIUM 8.8*  --    Liver Function Tests: Recent Labs  Lab 07/28/17 1619  AST 17  ALT 12*  ALKPHOS 111  BILITOT 0.5  PROT 7.8  ALBUMIN 4.2   Recent Labs  Lab 07/28/17 1619  LIPASE 23   No results for input(s): AMMONIA in the last 168 hours. CBC: Recent Labs  Lab 07/28/17 1648 07/28/17 2244  WBC 4.4 5.5  NEUTROABS 2.7  --   HGB 8.1*  7.7*  HCT 28.8* 27.4*  MCV 61.0* 61.0*  PLT 178 187   Cardiac Enzymes: Recent Labs  Lab 07/28/17 1619  TROPONINI <0.03    BNP (last 3 results) Recent Labs    07/28/17 1648  BNP 238.5*    ProBNP (last 3 results) No results for input(s): PROBNP in the last 8760 hours.   Serum creatinine: 0.65 mg/dL 16/10/96 0454 Estimated creatinine clearance: 119.4 mL/min  CBG: No results for input(s): GLUCAP in the last 168 hours.  Radiological Exams on Admission: Dg Chest 2 View  Result Date: 07/28/2017 CLINICAL DATA:  Productive cough with chills and malaise EXAM: CHEST - 2 VIEW COMPARISON:  01/26/2007 FINDINGS: Chronic cardiomegaly. Chronic vascular prominence. Artifact from EKG leads. There is no edema, consolidation, effusion, or pneumothorax. No acute osseous finding. IMPRESSION: 1. No evidence of active disease. 2. Chronic cardiomegaly. Electronically Signed   By:  Marnee Spring M.D.   On: 07/28/2017 17:03    EKG: Independently reviewed. NSR. No stemi.  Assessment/Plan Active Problems:   Hypertensive cardiomegaly with heart failure (HCC)   Accelerated HTN w/ Acute heart failure Serial troponin ordered, 1st neg Diuresis x1 Fluid seen on CXR Echo complete Ins and outs Continuous pulse ox D/c in AM Prn hydralazine Continue Coreg, Avapro; new meds started before transfer  Constipation Continue MiraLAX  Insomnia As needed Ambien  Low thyroid TSH mild elevation, f.u as OP  Code Status: FC  DVT Prophylaxis: lovenox Family Communication: noen available Disposition Plan: Pending Improvement, likely discharge tomorrow  Status: inpt tele  Haydee Salter, MD Family Medicine [facility] [email or web address] Password [email or web address]

## 2017-07-30 DIAGNOSIS — I5032 Chronic diastolic (congestive) heart failure: Secondary | ICD-10-CM

## 2017-07-30 DIAGNOSIS — D509 Iron deficiency anemia, unspecified: Secondary | ICD-10-CM

## 2017-07-30 DIAGNOSIS — I160 Hypertensive urgency: Principal | ICD-10-CM

## 2017-07-30 DIAGNOSIS — E039 Hypothyroidism, unspecified: Secondary | ICD-10-CM

## 2017-07-30 LAB — URINE CULTURE: CULTURE: NO GROWTH

## 2017-07-30 LAB — BASIC METABOLIC PANEL
Anion gap: 8 (ref 5–15)
BUN: 9 mg/dL (ref 6–20)
CALCIUM: 8.9 mg/dL (ref 8.9–10.3)
CHLORIDE: 103 mmol/L (ref 101–111)
CO2: 25 mmol/L (ref 22–32)
CREATININE: 0.57 mg/dL (ref 0.44–1.00)
GFR calc non Af Amer: >60 mL/min (ref >60)
Glucose, Bld: 107 mg/dL — ABNORMAL HIGH (ref 65–99)
Potassium: 3.9 mmol/L (ref 3.5–5.1)
SODIUM: 136 mmol/L (ref 135–145)

## 2017-07-30 LAB — TYPE AND SCREEN
ABO/RH(D): O POS
ANTIBODY SCREEN: NEGATIVE

## 2017-07-30 LAB — HIV ANTIBODY (ROUTINE TESTING W REFLEX): HIV Screen 4th Generation wRfx: NONREACTIVE

## 2017-07-30 LAB — GLUCOSE, CAPILLARY: GLUCOSE-CAPILLARY: 121 mg/dL — AB (ref 65–99)

## 2017-07-30 MED ORDER — GUAIFENESIN 100 MG/5ML PO SOLN
5.0000 mL | ORAL | Status: DC | PRN
Start: 2017-07-30 — End: 2017-07-30
  Administered 2017-07-30 (×2): 100 mg via ORAL
  Filled 2017-07-30 (×2): qty 5

## 2017-07-30 MED ORDER — FERROUS SULFATE 325 (65 FE) MG PO TABS: 325.0000 mg | ORAL_TABLET | Freq: Two times a day (BID) | ORAL | 0 refills | Status: DC

## 2017-07-30 MED ORDER — IRBESARTAN 75 MG PO TABS: 75.0000 mg | ORAL_TABLET | Freq: Every day | ORAL | 0 refills | Status: DC

## 2017-07-30 MED ORDER — CARVEDILOL 6.25 MG PO TABS: 6.2500 mg | ORAL_TABLET | Freq: Two times a day (BID) | ORAL | 0 refills | Status: DC

## 2017-07-30 MED ORDER — HYDROCHLOROTHIAZIDE 12.5 MG PO CAPS: 12.5000 mg | ORAL_CAPSULE | Freq: Every day | ORAL | 0 refills | Status: DC

## 2017-07-30 MED ORDER — DOCUSATE SODIUM 100 MG PO CAPS: 100.0000 mg | ORAL_CAPSULE | Freq: Every day | ORAL | 0 refills | Status: DC

## 2017-07-30 MED ORDER — FERUMOXYTOL INJECTION 510 MG/17 ML
510.0000 mg | Freq: Once | INTRAVENOUS | Status: AC
  Administered 2017-07-30: 510 mg via INTRAVENOUS
  Filled 2017-07-30: qty 17

## 2017-07-30 NOTE — Progress Notes (Signed)
Patient refused AM weight. Staff educated patient on reason for daily weight. Patients verbalized understanding and refuses a this time. Will pass off to day shift Rn to get patient's weight before breakfast. Will continue to monitor patient.

## 2017-07-30 NOTE — Discharge Instructions (Signed)
Managing Your Hypertension Hypertension is commonly called high blood pressure. This is when the force of your blood pressing against the walls of your arteries is too strong. Arteries are blood vessels that carry blood from your heart throughout your body. Hypertension forces the heart to work harder to pump blood, and may cause the arteries to become narrow or stiff. Having untreated or uncontrolled hypertension can cause heart attack, stroke, kidney disease, and other problems. What are blood pressure readings? A blood pressure reading consists of a higher number over a lower number. Ideally, your blood pressure should be below 120/80. The first ("top") number is called the systolic pressure. It is a measure of the pressure in your arteries as your heart beats. The second ("bottom") number is called the diastolic pressure. It is a measure of the pressure in your arteries as the heart relaxes. What does my blood pressure reading mean? Blood pressure is classified into four stages. Based on your blood pressure reading, your health care provider may use the following stages to determine what type of treatment you need, if any. Systolic pressure and diastolic pressure are measured in a unit called mm Hg. Normal  Systolic pressure: below 120.  Diastolic pressure: below 80. Elevated  Systolic pressure: 120-129.  Diastolic pressure: below 80. Hypertension stage 1  Systolic pressure: 130-139.  Diastolic pressure: 80-89. Hypertension stage 2  Systolic pressure: 140 or above.  Diastolic pressure: 90 or above. What health risks are associated with hypertension? Managing your hypertension is an important responsibility. Uncontrolled hypertension can lead to:  A heart attack.  A stroke.  A weakened blood vessel (aneurysm).  Heart failure.  Kidney damage.  Eye damage.  Metabolic syndrome.  Memory and concentration problems.  What changes can I make to manage my  hypertension? Hypertension can be managed by making lifestyle changes and possibly by taking medicines. Your health care provider will help you make a plan to bring your blood pressure within a normal range. Eating and drinking  Eat a diet that is high in fiber and potassium, and low in salt (sodium), added sugar, and fat. An example eating plan is called the DASH (Dietary Approaches to Stop Hypertension) diet. To eat this way: ? Eat plenty of fresh fruits and vegetables. Try to fill half of your plate at each meal with fruits and vegetables. ? Eat whole grains, such as whole wheat pasta, brown rice, or whole grain bread. Fill about one quarter of your plate with whole grains. ? Eat low-fat diary products. ? Avoid fatty cuts of meat, processed or cured meats, and poultry with skin. Fill about one quarter of your plate with lean proteins such as fish, chicken without skin, beans, eggs, and tofu. ? Avoid premade and processed foods. These tend to be higher in sodium, added sugar, and fat.  Reduce your daily sodium intake. Most people with hypertension should eat less than 1,500 mg of sodium a day.  Limit alcohol intake to no more than 1 drink a day for nonpregnant women and 2 drinks a day for men. One drink equals 12 oz of beer, 5 oz of wine, or 1 oz of hard liquor. Lifestyle  Work with your health care provider to maintain a healthy body weight, or to lose weight. Ask what an ideal weight is for you.  Get at least 30 minutes of exercise that causes your heart to beat faster (aerobic exercise) most days of the week. Activities may include walking, swimming, or biking.  Include exercise   to strengthen your muscles (resistance exercise), such as weight lifting, as part of your weekly exercise routine. Try to do these types of exercises for 30 minutes at least 3 days a week.  Do not use any products that contain nicotine or tobacco, such as cigarettes and e-cigarettes. If you need help quitting, ask  your health care provider.  Control any long-term (chronic) conditions you have, such as high cholesterol or diabetes. Monitoring  Monitor your blood pressure at home as told by your health care provider. Your personal target blood pressure may vary depending on your medical conditions, your age, and other factors.  Have your blood pressure checked regularly, as often as told by your health care provider. Working with your health care provider  Review all the medicines you take with your health care provider because there may be side effects or interactions.  Talk with your health care provider about your diet, exercise habits, and other lifestyle factors that may be contributing to hypertension.  Visit your health care provider regularly. Your health care provider can help you create and adjust your plan for managing hypertension. Will I need medicine to control my blood pressure? Your health care provider may prescribe medicine if lifestyle changes are not enough to get your blood pressure under control, and if:  Your systolic blood pressure is 130 or higher.  Your diastolic blood pressure is 80 or higher.  Take medicines only as told by your health care provider. Follow the directions carefully. Blood pressure medicines must be taken as prescribed. The medicine does not work as well when you skip doses. Skipping doses also puts you at risk for problems. Contact a health care provider if:  You think you are having a reaction to medicines you have taken.  You have repeated (recurrent) headaches.  You feel dizzy.  You have swelling in your ankles.  You have trouble with your vision. Get help right away if:  You develop a severe headache or confusion.  You have unusual weakness or numbness, or you feel faint.  You have severe pain in your chest or abdomen.  You vomit repeatedly.  You have trouble breathing. Summary  Hypertension is when the force of blood pumping through  your arteries is too strong. If this condition is not controlled, it may put you at risk for serious complications.  Your personal target blood pressure may vary depending on your medical conditions, your age, and other factors. For most people, a normal blood pressure is less than 120/80.  Hypertension is managed by lifestyle changes, medicines, or both. Lifestyle changes include weight loss, eating a healthy, low-sodium diet, exercising more, and limiting alcohol. This information is not intended to replace advice given to you by your health care provider. Make sure you discuss any questions you have with your health care provider. Document Released: 12/17/2011 Document Revised: 02/20/2016 Document Reviewed: 02/20/2016 Elsevier Interactive Patient Education  2018 Elsevier Inc.  

## 2017-07-30 NOTE — Discharge Summary (Signed)
Physician Discharge Summary  Andrea Barrett ZOX:[id number] DOB: December 08, 1975 DOA: 07/28/2017  PCP: Patient, No Pcp Per  Admit date: 07/28/2017 Discharge date: 07/30/2017  Admitted From: Home Disposition:  Home  Recommendations for Outpatient Follow-up:  1. Follow up with PCP in 1 week. Establish with PCP on discharge.  2. Please obtain CBC in 1 week as well as follow up on iron levels for iron deficiency anemia with negative FOBT, likely due to menstrual cycles.   3. Please obtain BMP in 1 week as patient started on ARB and HCTZ on discharge.  4. Repeat TSH and free T4 as outpatient.   Discharge Condition: Stable CODE STATUS: Full  Diet recommendation: Heart healthy   Brief/Interim Summary: Andrea Barrett is a 42 y.o. female with past medical history significant for hypertension, hypothyroidism who presented with chief complaint of cough.  Patient states that she had a cough for over 7 days.  No chest pain or shortness of breath.  She states that she did not try any over-the-counter medications.  Denies any sick contacts.  No history of heart failure.  No history of heart attack. In the ED, patient was found to be very hypertensive 203/105.  Also felt to have some mild interstitial edema on chest x-ray. She was admitted for further evaluation and treatment of hypertensive urgency. She was given IV lasix 20mg  and echocardiogram completed with results as below. BNP was 238.5 with negative troponin. Blood pressure was monitored and antihypertensives given with improved control. Anemia work up revealed iron deficiency anemia, FOBT negative. Patient admitted to heavy menstrual cycles. She was given IV iron in hospital and given iron supplementation on discharge.   Discharge Diagnoses:  Principal Problem:   Hypertensive urgency Active Problems:   Chronic diastolic CHF (congestive heart failure) (HCC)   Tension headache   Constipation   Iron deficiency anemia   Hypothyroidism   Discharge  Instructions  Discharge Instructions    Call MD for:  difficulty breathing, headache or visual disturbances   Complete by:  As directed    Call MD for:  extreme fatigue   Complete by:  As directed    Call MD for:  hives   Complete by:  As directed    Call MD for:  persistant dizziness or light-headedness   Complete by:  As directed    Call MD for:  persistant nausea and vomiting   Complete by:  As directed    Call MD for:  severe uncontrolled pain   Complete by:  As directed    Call MD for:  temperature >100.4   Complete by:  As directed    Diet - low sodium heart healthy   Complete by:  As directed    Discharge instructions   Complete by:  As directed    You were cared for by a hospitalist during your hospital stay. If you have any questions about your discharge medications or the care you received while you were in the hospital after you are discharged, you can call the unit and asked to speak with the hospitalist on call if the hospitalist that took care of you is not available. Once you are discharged, your primary care physician will handle any further medical issues. Please note that NO REFILLS for any discharge medications will be authorized once you are discharged, as it is imperative that you return to your primary care physician (or establish a relationship with a primary care physician if you do not have one) for your  aftercare needs so that they can reassess your need for medications and monitor your lab values.   Increase activity slowly   Complete by:  As directed      Allergies as of 07/30/2017   No Known Allergies     Medication List    STOP taking these medications   DAYQUIL PO   permethrin 5 % cream Commonly known as:  ELIMITE     TAKE these medications   carvedilol 6.25 MG tablet Commonly known as:  COREG Take 1 tablet (6.25 mg total) by mouth 2 (two) times daily with a meal.   docusate sodium 100 MG capsule Commonly known as:  COLACE Take 1 capsule  (100 mg total) by mouth daily.   ferrous sulfate 325 (65 FE) MG tablet Take 1 tablet (325 mg total) by mouth 2 (two) times daily with a meal.   GOODY HEADACHE PO Take 1 packet by mouth daily as needed (pain).   hydrochlorothiazide 12.5 MG capsule Commonly known as:  MICROZIDE Take 1 capsule (12.5 mg total) by mouth daily.   irbesartan 75 MG tablet Commonly known as:  AVAPRO Take 1 tablet (75 mg total) by mouth daily. Start taking on:  07/31/2017      Follow-up Information    [facility] Follow up.   Why:  Need to establish with PCP  Contact information: [address] [place] [address] [phone]         No Known Allergies  Consultations:  None   Procedures/Studies: Dg Chest 2 View  Result Date: 07/28/2017 CLINICAL DATA:  Productive cough with chills and malaise EXAM: CHEST - 2 VIEW COMPARISON:  01/26/2007 FINDINGS: Chronic cardiomegaly. Chronic vascular prominence. Artifact from EKG leads. There is no edema, consolidation, effusion, or pneumothorax. No acute osseous finding. IMPRESSION: 1. No evidence of active disease. 2. Chronic cardiomegaly. Electronically Signed   By: Marnee Spring M.D.   On: 07/28/2017 17:03    Echo - Left ventricle: The cavity size was normal. Wall thickness was   increased increased in a pattern of mild to moderate LVH.   Systolic function was normal. Wall motion was normal; there were   no regional wall motion abnormalities. - Left atrium: The atrium was mildly to moderately dilated.  Impressions:    1. Left ventricle systolic function is preserved visually   estimated at approximately 55 to 60%. Mild to moderate concentric   left ventricular hypertrophy.   2. Mild to moderate dilatation of the left atrium.    Discharge Exam: Vitals:   07/30/17 0515 07/30/17 0906  BP: (!) 147/73 (!) 163/93  Pulse: 66 65  Resp: 18   Temp: 98 F (36.7 C)   SpO2: 95%     General: Pt  is alert, awake, not in acute distress Cardiovascular: RRR, S1/S2 +, no rubs, no gallops Respiratory: CTA bilaterally, no wheezing, no rhonchi Abdominal: Soft, NT, ND, bowel sounds + Extremities: no edema, no cyanosis    The results of significant diagnostics from this hospitalization (including imaging, microbiology, ancillary and laboratory) are listed below for reference.     Microbiology: Recent Results (from the past 240 hour(s))  Urine culture     Status: None   Collection Time: 07/28/17  4:19 PM  Result Value Ref Range Status   Specimen Description   Final    URINE, RANDOM Performed at [facility], [address] [place], [place] [address]    Special Requests  Final    NONE Performed at [facility], [address], [place], [place] [address]    Culture   Final    NO GROWTH Performed at [facility], [address], [place], [place] [address]    Report Status 07/30/2017 FINAL  Final     Labs: BNP (last 3 results) Recent Labs    07/28/17 1648  BNP 238.5*   Basic Metabolic Panel: Recent Labs  Lab 07/28/17 1619 07/28/17 2244 07/29/17 1644 07/30/17 0402  NA 136  --   --  136  K 3.5  --   --  3.9  CL 102  --   --  103  CO2 23  --   --  25  GLUCOSE 99  --   --  107*  BUN 13  --   --  9  CREATININE 0.60 0.65 0.65 0.57  CALCIUM 8.8*  --   --  8.9   Liver Function Tests: Recent Labs  Lab 07/28/17 1619  AST 17  ALT 12*  ALKPHOS 111  BILITOT 0.5  PROT 7.8  ALBUMIN 4.2   Recent Labs  Lab 07/28/17 1619  LIPASE 23   No results for input(s): AMMONIA in the last 168 hours. CBC: Recent Labs  Lab 07/28/17 1648 07/28/17 2244 07/29/17 1644  WBC 4.4 5.5 4.6  NEUTROABS 2.7  --   --   HGB 8.1* 7.7* 7.6*  HCT 28.8* 27.4* 28.9*  MCV 61.0* 61.0* 63.0*  PLT 178 187 167   Cardiac Enzymes: Recent Labs  Lab 07/28/17 1619 07/29/17 1935  TROPONINI <0.03 <0.03   BNP: Invalid input(s): POCBNP CBG: Recent Labs  Lab  07/30/17 0229  GLUCAP 121*   D-Dimer No results for input(s): DDIMER in the last 72 hours. Hgb A1c No results for input(s): HGBA1C in the last 72 hours. Lipid Profile No results for input(s): CHOL, HDL, LDLCALC, TRIG, CHOLHDL, LDLDIRECT in the last 72 hours. Thyroid function studies Recent Labs    07/28/17 2244  TSH 4.717*   Anemia work up Recent Labs    07/29/17 1644  VITAMINB12 401  FOLATE 11.4  FERRITIN 5*  TIBC 482*  IRON 18*  RETICCTPCT 1.5   Urinalysis    Component Value Date/Time   COLORURINE YELLOW 07/28/2017 1619   APPEARANCEUR CLEAR 07/28/2017 1619   LABSPEC 1.020 07/28/2017 1619   PHURINE 6.0 07/28/2017 1619   GLUCOSEU NEGATIVE 07/28/2017 1619   HGBUR NEGATIVE 07/28/2017 1619   BILIRUBINUR NEGATIVE 07/28/2017 1619   KETONESUR NEGATIVE 07/28/2017 1619   PROTEINUR NEGATIVE 07/28/2017 1619   NITRITE NEGATIVE 07/28/2017 1619   LEUKOCYTESUR NEGATIVE 07/28/2017 1619   Sepsis Labs Invalid input(s): PROCALCITONIN,  WBC,  LACTICIDVEN Microbiology Recent Results (from the past 240 hour(s))  Urine culture     Status: None   Collection Time: 07/28/17  4:19 PM  Result Value Ref Range Status   Specimen Description   Final    URINE, RANDOM Performed at [facility], [address] [place], [place] [address]    Special Requests   Final    NONE Performed at [facility], [address] [place], [place] [address]    Culture   Final    NO GROWTH Performed at [facility], [address], [place], [place] [address]    Report Status 07/30/2017 FINAL  Final     Patient was seen and examined on the day of discharge and was found  to be in stable condition. Time coordinating discharge: 40 minutes including assessment and coordination of care, as well as examination of the patient.   SIGNED:  Noralee StainJennifer Cashus Halterman, DO [facility] Pager [phone]  If 7PM-7AM, please contact night-coverage [email or web address] Password  [facility] [email or web address] 07/30/2017, 10:32 AM

## 2017-07-30 NOTE — Care Management Note (Signed)
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 Emotional support given and encouraged patient to start taking better care of herself, she eats out daily but she plans to cook more, lose weight and control her stressors in her life. CM will continue to follow for progression of care.  Expected Discharge Date:  07/30/17               Expected Discharge Plan:  Home/Self Care  Discharge planning Services  CM Consult  Status of Service:  In process, will continue to follow  Reola MosherChandler, Fawna Cranmer L, RN,MHA,BSN [phone] 07/30/2017, 11:04 AM

## 2017-07-31 LAB — ABO/RH: ABO/RH(D): O POS

## 2017-12-18 ENCOUNTER — Ambulatory Visit: Payer: Self-pay | Admitting: Family Medicine

## 2018-01-08 ENCOUNTER — Ambulatory Visit: Payer: Self-pay | Admitting: Family Medicine

## 2018-01-13 ENCOUNTER — Encounter (HOSPITAL_COMMUNITY): Payer: Self-pay | Admitting: Emergency Medicine

## 2018-01-13 ENCOUNTER — Emergency Department (HOSPITAL_COMMUNITY)
Admission: EM | Admit: 2018-01-13 | Discharge: 2018-01-13 | Disposition: A | Discharge status: Home / Self Care | Payer: Self-pay | Attending: Emergency Medicine | Admitting: Emergency Medicine

## 2018-01-13 ENCOUNTER — Emergency Department (HOSPITAL_COMMUNITY): Payer: Self-pay

## 2018-01-13 DIAGNOSIS — I10 Essential (primary) hypertension: Secondary | ICD-10-CM

## 2018-01-13 DIAGNOSIS — Z79899 Other long term (current) drug therapy: Secondary | ICD-10-CM | POA: Insufficient documentation

## 2018-01-13 DIAGNOSIS — R609 Edema, unspecified: Secondary | ICD-10-CM | POA: Insufficient documentation

## 2018-01-13 DIAGNOSIS — E039 Hypothyroidism, unspecified: Secondary | ICD-10-CM | POA: Insufficient documentation

## 2018-01-13 DIAGNOSIS — I110 Hypertensive heart disease with heart failure: Secondary | ICD-10-CM | POA: Insufficient documentation

## 2018-01-13 DIAGNOSIS — I5032 Chronic diastolic (congestive) heart failure: Secondary | ICD-10-CM | POA: Insufficient documentation

## 2018-01-13 LAB — BASIC METABOLIC PANEL
Anion gap: 6 (ref 5–15)
BUN: 15 mg/dL (ref 6–20)
CALCIUM: 8.5 mg/dL — AB (ref 8.9–10.3)
CHLORIDE: 110 mmol/L (ref 98–111)
CO2: 21 mmol/L — AB (ref 22–32)
Creatinine, Ser: 0.56 mg/dL (ref 0.44–1.00)
GFR calc Af Amer: >60 mL/min (ref >60)
GFR calc non Af Amer: >60 mL/min (ref >60)
GLUCOSE: 109 mg/dL — AB (ref 70–99)
Potassium: 3 mmol/L — ABNORMAL LOW (ref 3.5–5.1)
Sodium: 137 mmol/L (ref 135–145)

## 2018-01-13 LAB — CBC
HEMATOCRIT: 38 % (ref 36.0–46.0)
Hemoglobin: 10.8 g/dL — ABNORMAL LOW (ref 12.0–15.0)
MCH: 23.9 pg — AB (ref 26.0–34.0)
MCHC: 28.4 g/dL — ABNORMAL LOW (ref 30.0–36.0)
MCV: 84.3 fL (ref 80.0–100.0)
NRBC: 0 % (ref 0.0–0.2)
PLATELETS: 149 10*3/uL — AB (ref 150–400)
RBC: 4.51 MIL/uL (ref 3.87–5.11)
RDW: 16.5 % — ABNORMAL HIGH (ref 11.5–15.5)
WBC: 3.7 10*3/uL — ABNORMAL LOW (ref 4.0–10.5)

## 2018-01-13 LAB — I-STAT TROPONIN, ED: Troponin i, poc: 0.01 ng/mL (ref 0.00–0.08)

## 2018-01-13 LAB — I-STAT BETA HCG BLOOD, ED (MC, WL, AP ONLY)

## 2018-01-13 LAB — BRAIN NATRIURETIC PEPTIDE: B Natriuretic Peptide: 68.5 pg/mL (ref 0.0–100.0)

## 2018-01-13 MED ORDER — HYDROCHLOROTHIAZIDE 25 MG PO TABS: 25.0000 mg | ORAL_TABLET | Freq: Every day | ORAL | 0 refills | Status: DC

## 2018-01-13 MED ORDER — IRBESARTAN 75 MG PO TABS: 75.0000 mg | ORAL_TABLET | Freq: Every day | ORAL | 0 refills | Status: DC

## 2018-01-13 MED ORDER — CARVEDILOL 6.25 MG PO TABS: 6.2500 mg | ORAL_TABLET | Freq: Two times a day (BID) | ORAL | 0 refills | Status: DC

## 2018-01-13 MED FILL — IRBESARTAN 75 MG TABLET: 75 | 30 days supply | Qty: 30 | Fill #0

## 2018-01-13 MED FILL — HYDROCHLOROTHIAZIDE 25 MG T: 25 | 30 days supply | Qty: 30 | Fill #0

## 2018-01-13 MED FILL — CARVEDILOL 6.25 MG TABLET: 6.25 | 30 days supply | Qty: 60 | Fill #0

## 2018-01-13 NOTE — ED Notes (Signed)
Patient transported to CT 

## 2018-01-13 NOTE — ED Triage Notes (Signed)
C/o short of breath and "fast heart rate and high blood pressure" was discharged from [facility] on Saturday for same- [facility], told to come here for high blood pressure. Has pedal edema - unable to get meds filled.

## 2018-01-13 NOTE — ED Provider Notes (Signed)
[facility] EMERGENCY DEPARTMENT Provider Note   CSN: [id number] Arrival date & time: 01/13/18  0759     History   Chief Complaint Chief Complaint  Patient presents with  . Hypertension  . fluid retention    HPI Andrea Barrett is a 42 y.o. female.  HPI Patient presents to the emergency department with a history of heart failure and high blood pressure.  Patient states that she was admitted to [facility] and left against advice because they were treating her poorly.  The patient states that she did not get her prescriptions due to the fact that she left AGAINST MEDICAL ADVICE.  The patient states that she is been out of her medicines for several days.  Patient states that she feels like she may be a swelling in her left lower extremity especially.  Patient states that she is been taking someone else's hydrochlorothiazide.  The patient denies chest pain, shortness of breath, headache,blurred vision, neck pain, fever, cough, weakness, numbness, dizziness, anorexia,  abdominal pain, nausea, vomiting, diarrhea, rash, back pain, dysuria, hematemesis, bloody stool, near syncope, or syncope. Past Medical History:  Diagnosis Date  . Hypertension   . Hypothyroidism   . Thyroid disease     Patient Active Problem List   Diagnosis Date Noted  . Chronic diastolic CHF (congestive heart failure) (HCC) 07/30/2017  . Tension headache 07/30/2017  . Constipation 07/30/2017  . Iron deficiency anemia 07/30/2017  . Hypothyroidism 07/30/2017  . Hypertensive urgency 07/28/2017    Past Surgical History:  Procedure Laterality Date  . CESAREAN SECTION     x 2  . TUBAL LIGATION       OB History   None      Home Medications    Prior to Admission medications   Medication Sig Start Date End Date Taking? Authorizing Provider  Aspirin-Acetaminophen-Caffeine (GOODY HEADACHE PO) Take 1 packet by mouth daily as needed (pain).   Yes [provider]  ferrous  sulfate 325 (65 FE) MG tablet Take 1 tablet (325 mg total) by mouth 2 (two) times daily with a meal. 07/30/17 01/13/18 Yes Noralee Stain, DO  metroNIDAZOLE (FLAGYL) 500 MG tablet Take 500 mg by mouth 2 (two) times daily. x7 days.   Yes [provider]  carvedilol (COREG) 6.25 MG tablet Take 1 tablet (6.25 mg total) by mouth 2 (two) times daily with a meal. 01/13/18   Ivett Luebbe, Cristal Deer, PA-C  docusate sodium (COLACE) 100 MG capsule Take 1 capsule (100 mg total) by mouth daily. Patient not taking: Reported on 01/13/2018 07/30/17   Noralee Stain, DO  hydrochlorothiazide (HYDRODIURIL) 25 MG tablet Take 1 tablet (25 mg total) by mouth daily. 01/13/18   Marelyn Rouser, Cristal Deer, PA-C  irbesartan (AVAPRO) 75 MG tablet Take 1 tablet (75 mg total) by mouth daily. 01/13/18   Charlestine Night, PA-C    Family History Family History  Problem Relation Age of Onset  . Other Mother        HTN    Social History Social History   Tobacco Use  . Smoking status: Never Smoker  . Smokeless tobacco: Never Used  Substance Use Topics  . Alcohol use: No  . Drug use: No     Allergies   Patient has no known allergies.   Review of Systems Review of Systems  All other systems negative except as documented in the HPI. All pertinent positives and negatives as reviewed in the HPI. Physical Exam Updated Vital Signs BP (!) 163/98  Pulse 80   Temp 98.3 F (36.8 C) (Oral)   Resp 20   Ht 5\' 6"  (1.676 m)   Wt 123.4 kg   SpO2 100%   BMI 43.90 kg/m   Physical Exam  Constitutional: She is oriented to person, place, and time. She appears well-developed and well-nourished. No distress.  HENT:  Head: Normocephalic and atraumatic.  Mouth/Throat: Oropharynx is clear and moist.  Eyes: Pupils are equal, round, and reactive to light.  Neck: Normal range of motion. Neck supple.  Cardiovascular: Normal rate, regular rhythm and normal heart sounds. Exam reveals no gallop and no friction rub.  No murmur  heard. Pulmonary/Chest: Effort normal and breath sounds normal. No respiratory distress. She has no wheezes.  Abdominal: Soft. Bowel sounds are normal. She exhibits no distension. There is no tenderness.  Musculoskeletal:  She has trace edema in both lower extremities.  Neurological: She is alert and oriented to person, place, and time. She exhibits normal muscle tone. Coordination normal.  Skin: Skin is warm and dry. Capillary refill takes less than 2 seconds. No rash noted. No erythema.  Psychiatric: She has a normal mood and affect. Her behavior is normal.  Nursing note and vitals reviewed.    ED Treatments / Results  Labs (all labs ordered are listed, but only abnormal results are displayed) Labs Reviewed  BASIC METABOLIC PANEL - Abnormal; Notable for the following components:      Result Value   Potassium 3.0 (*)    CO2 21 (*)    Glucose, Bld 109 (*)    Calcium 8.5 (*)    All other components within normal limits  CBC - Abnormal; Notable for the following components:   WBC 3.7 (*)    Hemoglobin 10.8 (*)    MCH 23.9 (*)    MCHC 28.4 (*)    RDW 16.5 (*)    Platelets 149 (*)    All other components within normal limits  BRAIN NATRIURETIC PEPTIDE  I-STAT TROPONIN, ED  I-STAT BETA HCG BLOOD, ED ([facility], [facility], [facility] ONLY)    EKG None  Radiology Dg Chest 2 View  Result Date: 01/13/2018 CLINICAL DATA:  Tachycardia with shortness of breath.  Hypertension. EXAM: CHEST - 2 VIEW COMPARISON:  January 05, 2018 FINDINGS: There is no edema or consolidation. Heart is mildly enlarged with pulmonary vascularity normal. No adenopathy. No bone lesions. IMPRESSION: Stable cardiac prominence.  No edema or consolidation. Electronically Signed   By: Bretta Bang III M.D.   On: 01/13/2018 08:49    Procedures Procedures (including critical care time)  Medications Ordered in ED Medications - No data to display   Initial Impression / Assessment and Plan / ED Course  I have reviewed the triage  vital signs and the nursing notes.  Pertinent labs & imaging results that were available during my care of the patient were reviewed by me and considered in my medical decision making (see chart for details).     The patient will be given her prescriptions in case management is helping with cough.  Patient's reason for her visit was to specifically getting medications.  She has been stable here in the emergency department.  She is advised to need to follow-up with her primary doctor.  Final Clinical Impressions(s) / ED Diagnoses   Final diagnoses:  Essential hypertension    ED Discharge Orders         Ordered    carvedilol (COREG) 6.25 MG tablet  2 times daily with meals  01/13/18 1419    hydrochlorothiazide (HYDRODIURIL) 25 MG tablet  Daily     01/13/18 1419    irbesartan (AVAPRO) 75 MG tablet  Daily     01/13/18 1419           Charlestine Night, PA-C 01/14/18 1554    Arby Barrette, MD 01/16/18 [phone]

## 2018-01-13 NOTE — Discharge Instructions (Addendum)
Return here as needed.  Follow-up with your primary doctor as soon as possible. °

## 2018-01-13 NOTE — ED Notes (Signed)
Pt reports not being able to avoid "fluid pill" pt reports [place] in completing a Medicaid application on her behalf

## 2018-01-19 ENCOUNTER — Encounter: Payer: Self-pay | Admitting: Family Medicine

## 2018-01-19 ENCOUNTER — Ambulatory Visit (INDEPENDENT_AMBULATORY_CARE_PROVIDER_SITE_OTHER): Payer: Self-pay | Admitting: Family Medicine

## 2018-01-19 ENCOUNTER — Ambulatory Visit: Payer: Self-pay | Attending: Family Medicine

## 2018-01-19 VITALS — BP 158/91 | HR 56 | Temp 98.4°F | Resp 17 | Ht 67.5 in | Wt 276.4 lb

## 2018-01-19 DIAGNOSIS — I5032 Chronic diastolic (congestive) heart failure: Secondary | ICD-10-CM

## 2018-01-19 DIAGNOSIS — E876 Hypokalemia: Secondary | ICD-10-CM

## 2018-01-19 DIAGNOSIS — Z131 Encounter for screening for diabetes mellitus: Secondary | ICD-10-CM | POA: Insufficient documentation

## 2018-01-19 DIAGNOSIS — I10 Essential (primary) hypertension: Secondary | ICD-10-CM

## 2018-01-19 DIAGNOSIS — D500 Iron deficiency anemia secondary to blood loss (chronic): Secondary | ICD-10-CM

## 2018-01-19 DIAGNOSIS — E039 Hypothyroidism, unspecified: Secondary | ICD-10-CM

## 2018-01-19 MED ORDER — CARVEDILOL 6.25 MG PO TABS: 12.5000 mg | ORAL_TABLET | Freq: Two times a day (BID) | ORAL | 1 refills | Status: DC

## 2018-01-19 MED ORDER — POTASSIUM CHLORIDE CRYS ER 20 MEQ PO TBCR: 20.0000 meq | EXTENDED_RELEASE_TABLET | Freq: Every day | ORAL | 1 refills | Status: DC

## 2018-01-19 MED ORDER — FUROSEMIDE 20 MG PO TABS: 20.0000 mg | ORAL_TABLET | Freq: Two times a day (BID) | ORAL | 3 refills | Status: DC

## 2018-01-19 MED ORDER — IRBESARTAN 75 MG PO TABS: 75.0000 mg | ORAL_TABLET | Freq: Every day | ORAL | 1 refills | Status: DC

## 2018-01-19 MED ORDER — AMLODIPINE BESYLATE 10 MG PO TABS: 10.0000 mg | ORAL_TABLET | Freq: Every day | ORAL | 1 refills | Status: DC

## 2018-01-19 MED FILL — FUROSEMIDE 20 MG TABLET: 20 | 15 days supply | Qty: 30 | Fill #0

## 2018-01-19 MED FILL — POTASSIUM CL ER 20 MEQ TABL: 20 | 30 days supply | Qty: 30 | Fill #0

## 2018-01-19 MED FILL — CARVEDILOL 6.25 MG TABLET: 6.25 | 30 days supply | Qty: 120 | Fill #0

## 2018-01-19 MED FILL — AMLODIPINE BESYLATE 10 MG T: 10 | 30 days supply | Qty: 30 | Fill #0

## 2018-01-19 NOTE — Patient Instructions (Addendum)
I have ordered labs for you to have drawn at [facility]. You will also pickup your medications at [facility].   [facility]  [address] [place], [place] [address] [phone]   Review the attached medications list. Only take medications listed.   Thank you for choosing Primary Care at [facility] for your medical home!    Andrea Barrett was seen by Andrea Courts, FNP today.   Andrea Barrett's primary care doctor is Bing Neighbors, FNP.   For the best care possible,  you should try to see Andrea Courts, FNP  whenever you come to clinic.   We look forward to seeing you again soon!  If you have any questions about your visit today,  please call us at   Or feel free to reach your provider via MyChart.

## 2018-01-19 NOTE — Progress Notes (Signed)
Andrea Barrett, is a 42 y.o. female  ZOX:[id number]  [id number]  DOB - [birth year]  CC:  Chief Complaint  Patient presents with  . Establish Care  . Follow-up    ED f/up 10/9-elevated BP & lower extremity swelling. states that the BLE swelling hasn't improved since restarting medication        HPI: Andrea Barrett is a 42 y.o. female is here today to establish care.   Andrea Barrett has Chronic diastolic CHF (congestive heart failure) (HCC); Iron deficiency anemia; and Hypothyroidism on their problem list.   Today's visit:  Andrea Barrett presented to [facility] ER requesting a refill of blood pressure medication. She had previously presented to [facility]  On 01/05/2018 and was admitted for shortness of breath secondary to CHF exacerbation and hypertension. Blood pressures were ranged from (179-208) systolic and (82-108)ring admission. She had not been taking blood pressure medication consistently prior to admission.  Tests performed at [facility]: Echocardiogram-EF 60-65% and only significant for moderate LVH CT of head w/o-negative for any abnormal findings EKG:  Sinus Rhythm with LVH no ST abnormalities noted Chest X-ray: significant for cardiomegaly and pulmonary vascular congestion  Renal Duplex Bilateral Artery: No obvious sign of arterial obstruction/stenosis  Notes from EMR indicate patient became belligerent when told she would need to be NPO for a procedure. Security was called to the bedside. Patient left AMA [facility]) with a blood pressure reading 200/100. She was unable to receive prescriptions as she left prior completed recommendations by attending provider.  Today patient reports that she has not been checking her blood pressure at home however notes that it is been high.  She has been self adjusting her medications and taking relative medications to try to control her blood pressure.  She reports she has been taking extra doses of carvedilol  (of note her heart rate is low today, 56), taken another family members hydralazine 100 mg twice a day although not taking consistently as she can obtain that medication.  Also notes she has been taking Norvasc till her friend ran out of that medication.  He was also prescribed furosemide upon leaving the hospital however was unable to obtain that medication.  Complains of worsening bilateral ankle swelling.  She has been attempting to donate plasma however they have refused to accept her as a candidate given her elevated blood pressure. She denies shortness of breath or chest pain. She brought a paper with her today for permission to participate in the plasma program however documentation states that patient has any cardiovascular disease they are not a candidate, therefore patient was informed that this documentation would not be completed.  She she noted that she understands and wants to get healthy.  She participates in no routine physical exercise nor adheres to a low-sodium food diet.  Current BMI Body mass index is 42.65 kg/m. Patient denies new headaches, chest pain, abdominal pain, nausea, new weakness , numbness or tingling.  Current medications: Current Outpatient Medications:  .  carvedilol (COREG) 6.25 MG tablet, Take 1 tablet (6.25 mg total) by mouth 2 (two) times daily with a meal., Disp: 60 tablet, Rfl: 0 .  hydrochlorothiazide (HYDRODIURIL) 25 MG tablet, Take 1 tablet (25 mg total) by mouth daily., Disp: 30 tablet, Rfl: 0 .  irbesartan (AVAPRO) 75 MG tablet, Take 1 tablet (75 mg total) by mouth daily., Disp: 30 tablet, Rfl: 0   Pertinent family medical history: family history includes Hypertension mother.   No Known  Allergies  Social History   Socioeconomic History  . Marital status: Single    Spouse name: Not on file  . Number of children: Not on file  . Years of education: Not on file  . Highest education level: Not on file  Occupational History  . Not on file  Social Needs   . Financial resource strain: Not on file  . Food insecurity:    Worry: Not on file    Inability: Not on file  . Transportation needs:    Medical: Not on file    Non-medical: Not on file  Tobacco Use  . Smoking status: Never Smoker  . Smokeless tobacco: Never Used  Substance and Sexual Activity  . Alcohol use: No  . Drug use: No  . Sexual activity: Yes    Birth control/protection: Surgical  Lifestyle  . Physical activity:    Days per week: Not on file    Minutes per session: Not on file  . Stress: Not on file  Relationships  . Social connections:    Talks on phone: Not on file    Gets together: Not on file    Attends religious service: Not on file    Active member of club or organization: Not on file    Attends meetings of clubs or organizations: Not on file    Relationship status: Not on file  . Intimate partner violence:    Fear of current or ex partner: Not on file    Emotionally abused: Not on file    Physically abused: Not on file    Forced sexual activity: Not on file  Other Topics Concern  . Not on file  Social History Narrative  . Not on file    Review of Systems: Review of Systems  Constitutional: Positive for malaise/fatigue.  Respiratory:       Occassional SOB with exertional activities   Cardiovascular: Negative for chest pain and palpitations.       Ankle swelling bilateral   Gastrointestinal: Negative.   Genitourinary: Negative.   Musculoskeletal: Negative.   Skin: Negative.   Neurological: Negative.   Endo/Heme/Allergies: Negative.     Objective:   Vitals:   01/19/18 1343  BP: (!) 158/91  Pulse: (!) 56  Resp: 17  Temp: 98.4 F (36.9 C)  SpO2: 95%    BP Readings from Last 3 Encounters:  01/13/18 (!) 173/82  07/30/17 (!) 163/93  03/01/13 147/85    Filed Weights   01/19/18 1341 01/19/18 1343  Weight: 276 lb 6.4 oz (125.4 kg) 276 lb 6.4 oz (125.4 kg)      Physical Exam: Constitutional: Patient appears well-developed and  morbidly obese. No distress. HENT: Normocephalic, atraumatic, External right and left ear normal.  Eyes: Conjunctivae and EOM are normal. PERRLA, no scleral icterus. Neck: Normal ROM. Neck supple. No JVD. No tracheal deviation. No thyromegaly. CVS: RRR, S1/S2 +, no murmurs, no gallops, no carotid bruit.  Pulmonary: Diminished breath sounds, no stridor, rhonchi, wheezes, rales.  Abdominal: Soft. BS +, no distension, tenderness, rebound or guarding.  Musculoskeletal: Normal range of motion. Bilateral ankle/foot edema.   Neuro: Alert. Normal muscle tone coordination. Normal gait.  Skin: Skin is warm and dry. No rash noted. Not diaphoretic. No erythema. No pallor. Psychiatric: Normal mood and affect. Behavior, judgment, thought content normal. Lab Results  Lab Results  Component Value Date   WBC 3.7 (L) 01/13/2018   HGB 10.8 (L) 01/13/2018   HCT 38.0 01/13/2018   MCV 84.3 01/13/2018  PLT 149 (L) 01/13/2018   Lab Results  Component Value Date   CREATININE 0.56 01/13/2018   BUN 15 01/13/2018   NA 137 01/13/2018   K 3.0 (L) 01/13/2018   CL 110 01/13/2018   CO2 21 (L) 01/13/2018       Assessment and plan:  1. Chronic diastolic CHF (congestive heart failure) (HCC) -performed medication education and advised of the danger associated with taking non-prescribed medications. -she is not weighing herself daily, although note she continues to be able to fit her same clothing and shoes. -Resume Furosamide 20 mg twice daily with Potassium replacement - Comprehensive metabolic panel; Future - Ambulatory referral to Cardiology, to HF clinic   2. Hypothyroidism, unspecified type -Check - Thyroid Panel With TSH; Future   3. Iron deficiency anemia due to chronic blood loss,  checking iron panel and CBC with Differential  Will start ferrous 325 TID if iron level is low.  4. Hypokalemia, repeating CMP, K 3.0 at ER visit  - Comprehensive metabolic panel; Future  5. Screening for diabetes  mellitus - Hemoglobin A1c; Future  6. Morbid obesity (HCC) Encouraged efforts to reduce weight include engaging in physical activity as tolerated with goal of 150 minutes per week. Improve dietary choices and eat a meal regimen consistent with a Mediterranean or DASH diet. Reduce simple carbohydrates. Do not skip meals and eat healthy snacks throughout the day to avoid over-eating at dinner. Set a goal weight loss that is achievable for you. Patient is struggling financially and at this point weight loss is not a primary focus. Will continue to provide reinforcement and support at subsequent visits.  7. Accelerated Hypertension We have discussed target BP range and blood pressure goal. I have advised patient to check BP regularly and to call us back or report to clinic if the numbers are consistently higher than 140/90. We discussed the importance of compliance with medical therapy and DASH diet recommended, consequences of uncontrolled hypertension discussed.   Adjusted medication regiment as follows:  Added Amlodipine 10 mg once daily Continue Irbersartan 75 mg daily Increased Carvedilol 12.5 mg twice daily (will continue monitor pulse)    Return in about 1 week (around 01/26/2018), or BP and Pulse check.  A total of 50  minutes spent, greater than 50 % of this time was spent counseling and coordination of care.  Meds ordered this encounter  Medications  . carvedilol (COREG) 6.25 MG tablet    Sig: Take 2 tablets (12.5 mg total) by mouth 2 (two) times daily with a meal.    Dispense:  120 tablet    Refill:  1  . amLODipine (NORVASC) 10 MG tablet    Sig: Take 1 tablet (10 mg total) by mouth daily.    Dispense:  30 tablet    Refill:  1  . furosemide (LASIX) 20 MG tablet    Sig: Take 1 tablet (20 mg total) by mouth 2 (two) times daily. Take 1 potassium tablet with lasix dose.    Dispense:  30 tablet    Refill:  3  . irbesartan (AVAPRO) 75 MG tablet    Sig: Take 1 tablet (75 mg total)  by mouth daily.    Dispense:  30 tablet    Refill:  1  . potassium chloride SA (K-DUR,KLOR-CON) 20 MEQ tablet    Sig: Take 1 tablet (20 mEq total) by mouth daily.    Dispense:  30 tablet    Refill:  1  . levothyroxine (SYNTHROID) 25 MCG  tablet    Sig: Take 1 tablet (25 mcg total) by mouth daily before breakfast. Allow 30 minutes before consuming food after taking medication    Dispense:  30 tablet    Refill:  2  . ferrous sulfate (FERROUSUL) 325 (65 FE) MG tablet    Sig: Take 1 tablet (325 mg total) by mouth 3 (three) times daily with meals.    Dispense:  90 tablet    Refill:  1    Orders Placed This Encounter  Procedures  . Thyroid Panel With TSH    Standing Status:   Future    Number of Occurrences:   1    Standing Expiration Date:   01/20/2019  . Iron, TIBC and Ferritin Panel    Standing Status:   Future    Number of Occurrences:   1    Standing Expiration Date:   01/20/2019  . Comprehensive metabolic panel    Standing Status:   Future    Number of Occurrences:   1    Standing Expiration Date:   01/20/2019  . Hemoglobin A1c    Standing Status:   Future    Number of Occurrences:   1    Standing Expiration Date:   01/20/2019  . CBC with Differential  . Ambulatory referral to Cardiology    Referral Priority:   Routine    Referral Type:   Consultation    Referral Reason:   Specialty Services Required    Requested Specialty:   Cardiology    Number of Visits Requested:   1      The patient was given clear instructions to go to ER or return to medical center if symptoms don't improve, worsen or new problems develop. The patient verbalized understanding. The patient was advised  to call and obtain lab results if they haven't heard anything from out office within 7-10 business days.  [name], FNP Primary Care at [facility] [address] [place] [address] [phone]: [phone]    This note has been created with Dragon speech  recognition software and Paediatric nurse. Any transcriptional errors are unintentional.

## 2018-01-20 LAB — HEMOGLOBIN A1C
ESTIMATED AVERAGE GLUCOSE: 103 mg/dL
Hgb A1c MFr Bld: 5.2 % (ref 4.8–5.6)

## 2018-01-20 LAB — COMPREHENSIVE METABOLIC PANEL
ALBUMIN: 4.1 g/dL (ref 3.5–5.5)
ALT: 16 IU/L (ref 0–32)
AST: 18 IU/L (ref 0–40)
Albumin/Globulin Ratio: 1.6 (ref 1.2–2.2)
Alkaline Phosphatase: 82 IU/L (ref 39–117)
BUN / CREAT RATIO: 25 — AB (ref 9–23)
BUN: 14 mg/dL (ref 6–24)
Bilirubin Total: <0.2 mg/dL (ref 0.0–1.2)
CALCIUM: 9.2 mg/dL (ref 8.7–10.2)
CHLORIDE: 98 mmol/L (ref 96–106)
CO2: 23 mmol/L (ref 20–29)
CREATININE: 0.56 mg/dL — AB (ref 0.57–1.00)
GFR, EST AFRICAN AMERICAN: 133 mL/min/{1.73_m2} (ref >59)
GFR, EST NON AFRICAN AMERICAN: 115 mL/min/{1.73_m2} (ref >59)
GLUCOSE: 83 mg/dL (ref 65–99)
Globulin, Total: 2.5 g/dL (ref 1.5–4.5)
Potassium: 3.8 mmol/L (ref 3.5–5.2)
Sodium: 139 mmol/L (ref 134–144)
TOTAL PROTEIN: 6.6 g/dL (ref 6.0–8.5)

## 2018-01-20 LAB — IRON,TIBC AND FERRITIN PANEL
Ferritin: 11 ng/mL — ABNORMAL LOW (ref 15–150)
IRON SATURATION: 7 % — AB (ref 15–55)
IRON: 30 ug/dL (ref 27–159)
Total Iron Binding Capacity: 407 ug/dL (ref 250–450)
UIBC: 377 ug/dL (ref 131–425)

## 2018-01-20 LAB — THYROID PANEL WITH TSH
Free Thyroxine Index: 2 (ref 1.2–4.9)
T3 UPTAKE RATIO: 26 % (ref 24–39)
T4 TOTAL: 7.7 ug/dL (ref 4.5–12.0)
TSH: 4.83 u[IU]/mL — ABNORMAL HIGH (ref 0.450–4.500)

## 2018-01-20 MED ORDER — FERROUS SULFATE 325 (65 FE) MG PO TABS: 325.0000 mg | ORAL_TABLET | Freq: Three times a day (TID) | ORAL | 1 refills | Status: DC

## 2018-01-20 MED ORDER — LEVOTHYROXINE SODIUM 25 MCG PO TABS: 25.0000 ug | ORAL_TABLET | Freq: Every day | ORAL | 2 refills | Status: DC

## 2018-01-21 LAB — CBC WITH DIFFERENTIAL/PLATELET
Basophils Absolute: 0 10*3/uL (ref 0.0–0.2)
Basos: 0 %
EOS (ABSOLUTE): 0.2 10*3/uL (ref 0.0–0.4)
EOS: 4 %
HEMATOCRIT: 36.7 % (ref 34.0–46.6)
HEMOGLOBIN: 11.3 g/dL (ref 11.1–15.9)
IMMATURE GRANULOCYTES: 1 %
Immature Grans (Abs): 0 10*3/uL (ref 0.0–0.1)
Lymphocytes Absolute: 1.5 10*3/uL (ref 0.7–3.1)
Lymphs: 37 %
MCH: 25.9 pg — ABNORMAL LOW (ref 26.6–33.0)
MCHC: 30.8 g/dL — ABNORMAL LOW (ref 31.5–35.7)
MCV: 84 fL (ref 79–97)
MONOCYTES: 7 %
Monocytes Absolute: 0.3 10*3/uL (ref 0.1–0.9)
NEUTROS PCT: 51 %
Neutrophils Absolute: 2 10*3/uL (ref 1.4–7.0)
Platelets: 224 10*3/uL (ref 150–450)
RBC: 4.37 x10E6/uL (ref 3.77–5.28)
RDW: 16.6 % — ABNORMAL HIGH (ref 12.3–15.4)
WBC: 4 10*3/uL (ref 3.4–10.8)

## 2018-01-21 LAB — SPECIMEN STATUS REPORT

## 2018-01-21 MED FILL — FERROUS SULFATE 325 MG TAB: 325 (65 FE) | 30 days supply | Qty: 90 | Fill #0

## 2018-01-21 MED FILL — LEVOTHYROXINE 25 MCG TABLET: 25 | 30 days supply | Qty: 30 | Fill #0

## 2018-01-21 NOTE — Progress Notes (Signed)
Patient notified of results. Expressed understanding.

## 2018-01-26 ENCOUNTER — Ambulatory Visit: Payer: Self-pay | Admitting: Family Medicine

## 2018-01-29 ENCOUNTER — Ambulatory Visit: Payer: Self-pay | Admitting: Family Medicine

## 2018-01-29 VITALS — BP 148/83

## 2018-01-29 DIAGNOSIS — Z719 Counseling, unspecified: Secondary | ICD-10-CM

## 2018-01-29 MED ORDER — FUROSEMIDE 20 MG PO TABS: 40.0000 mg | ORAL_TABLET | Freq: Two times a day (BID) | ORAL | 3 refills | Status: DC

## 2018-01-29 MED FILL — FUROSEMIDE 20 MG TABLET: 20 | 7 days supply | Qty: 30 | Fill #0

## 2018-01-29 NOTE — Progress Notes (Signed)
Patient notified of results & recommendations. Expressed understanding.

## 2018-01-29 NOTE — Progress Notes (Signed)
BP check visit-nurse visit.  Elevated 148/83, pulse 74.  Increase Lasix 40 mg twice daily. Continue potassium  Return 2 weeks for follow-up with me. To re-evaluate BP and swelling.

## 2018-02-03 ENCOUNTER — Ambulatory Visit: Payer: Self-pay

## 2018-02-04 MED FILL — FUROSEMIDE 20 MG TABLET: 20 | 15 days supply | Qty: 30 | Fill #1

## 2018-02-10 ENCOUNTER — Ambulatory Visit: Payer: Self-pay

## 2018-02-11 ENCOUNTER — Ambulatory Visit: Payer: Self-pay | Admitting: Family Medicine

## 2018-02-15 ENCOUNTER — Ambulatory Visit: Payer: Self-pay

## 2018-02-16 ENCOUNTER — Ambulatory Visit: Payer: Self-pay

## 2018-02-18 ENCOUNTER — Ambulatory Visit (INDEPENDENT_AMBULATORY_CARE_PROVIDER_SITE_OTHER): Payer: Self-pay | Admitting: Family Medicine

## 2018-02-18 VITALS — BP 153/96 | HR 76

## 2018-02-18 DIAGNOSIS — R609 Edema, unspecified: Secondary | ICD-10-CM

## 2018-02-18 DIAGNOSIS — I10 Essential (primary) hypertension: Secondary | ICD-10-CM

## 2018-02-18 DIAGNOSIS — I5032 Chronic diastolic (congestive) heart failure: Secondary | ICD-10-CM

## 2018-02-18 NOTE — Progress Notes (Signed)
Here for BP check. Still having c/o headaches & B foot/leg swelling(L>R). KWalker, CMA. Vitals:   02/18/18 1049  BP: (!) 153/96  Pulse: 76

## 2018-02-19 ENCOUNTER — Ambulatory Visit (INDEPENDENT_AMBULATORY_CARE_PROVIDER_SITE_OTHER): Payer: Self-pay | Admitting: Family Medicine

## 2018-02-19 ENCOUNTER — Encounter: Payer: Self-pay | Admitting: Family Medicine

## 2018-02-19 VITALS — BP 136/96 | HR 64 | Resp 17 | Ht 67.5 in | Wt 273.6 lb

## 2018-02-19 DIAGNOSIS — I10 Essential (primary) hypertension: Secondary | ICD-10-CM

## 2018-02-19 DIAGNOSIS — I5032 Chronic diastolic (congestive) heart failure: Secondary | ICD-10-CM

## 2018-02-19 DIAGNOSIS — R609 Edema, unspecified: Secondary | ICD-10-CM

## 2018-02-19 MED ORDER — CARVEDILOL 25 MG PO TABS: 25.0000 mg | ORAL_TABLET | Freq: Two times a day (BID) | ORAL | 3 refills | Status: DC

## 2018-02-19 MED ORDER — CARVEDILOL 6.25 MG PO TABS: 25.0000 mg | ORAL_TABLET | Freq: Two times a day (BID) | ORAL | 1 refills | Status: DC

## 2018-02-19 MED ORDER — AMLODIPINE BESYLATE 10 MG PO TABS: 10.0000 mg | ORAL_TABLET | Freq: Every day | ORAL | 1 refills | Status: DC

## 2018-02-19 MED ORDER — POTASSIUM CHLORIDE CRYS ER 20 MEQ PO TBCR: 20.0000 meq | EXTENDED_RELEASE_TABLET | Freq: Two times a day (BID) | ORAL | 0 refills | Status: DC

## 2018-02-19 MED FILL — CARVEDILOL 25 MG TABLET: 25 | 30 days supply | Qty: 60 | Fill #0

## 2018-02-19 MED FILL — AMLODIPINE BESYLATE 10 MG T: 10 | 30 days supply | Qty: 30 | Fill #1

## 2018-02-19 MED FILL — POTASSIUM CL ER 20 MEQ TAB: 20 | 30 days supply | Qty: 30 | Fill #1

## 2018-02-19 MED FILL — FUROSEMIDE 20 MG TABLET: 20 | 7 days supply | Qty: 30 | Fill #1

## 2018-02-19 NOTE — Progress Notes (Signed)
Patient ID: Andrea Barrett, female    DOB: 11/08/1975, 42 y.o.   MRN: [id number]  PCP: Bing Neighbors, FNP  Chief Complaint  Patient presents with  . Hypertension  . Foot Swelling    B foot swelling(L>R)    Subjective:  HPI Andrea Barrett is a 42 y.o. female presents for evaluation hypertension and bilateral lower leg and foot swelling. Currently has taken all medication except was not taking irbesartan. She did not know she was to continue ARB.  Continues to complain of worsening lower extremity swelling in spite of limiting sodium intake, diuretic therapy, and elevation. She suffers from CHF and has been referred to heart failure clinic and is awaiting scheduling. Blood pressure improved today.  Social History   Socioeconomic History  . Marital status: Single    Spouse name: Not on file  . Number of children: Not on file  . Years of education: Not on file  . Highest education level: Not on file  Occupational History  . Not on file  Social Needs  . Financial resource strain: Not on file  . Food insecurity:    Worry: Not on file    Inability: Not on file  . Transportation needs:    Medical: Not on file    Non-medical: Not on file  Tobacco Use  . Smoking status: Never Smoker  . Smokeless tobacco: Never Used  Substance and Sexual Activity  . Alcohol use: No  . Drug use: No  . Sexual activity: Yes    Birth control/protection: Surgical  Lifestyle  . Physical activity:    Days per week: Not on file    Minutes per session: Not on file  . Stress: Not on file  Relationships  . Social connections:    Talks on phone: Not on file    Gets together: Not on file    Attends religious service: Not on file    Active member of club or organization: Not on file    Attends meetings of clubs or organizations: Not on file    Relationship status: Not on file  . Intimate partner violence:    Fear of current or ex partner: Not on file    Emotionally abused: Not on file     Physically abused: Not on file    Forced sexual activity: Not on file  Other Topics Concern  . Not on file  Social History Narrative  . Not on file    Family History  Problem Relation Age of Onset  . Other Mother        HTN   Review of Systems  Complains of headache (trying to hydrate well) and lower extremity edema.  Denies chest pain, headaches, cough, abdominal pain, urinary retention, urinary frequency, wheezing,chest tightness,depression, suicidal ideations or auditory hallucinations. Patient Active Problem List   Diagnosis Date Noted  . Chronic diastolic CHF (congestive heart failure) (HCC) 07/30/2017  . Iron deficiency anemia 07/30/2017  . Hypothyroidism 07/30/2017    No Known Allergies  Prior to Admission medications   Medication Sig Start Date End Date Taking? Authorizing Provider  amLODipine (NORVASC) 10 MG tablet Take 1 tablet (10 mg total) by mouth daily. Hold medication until next follow-up 11/25 02/19/18  Yes Bing Neighbors, FNP  carvedilol (COREG) 6.25 MG tablet Take 4 tablets (25 mg total) by mouth 2 (two) times daily with a meal. 02/19/18 04/20/18 Yes Bing Neighbors, FNP  ferrous sulfate (FERROUSUL) 325 (65 FE) MG tablet Take 1 tablet (325  mg total) by mouth 3 (three) times daily with meals. 01/20/18 02/19/18 Yes Bing Neighbors, FNP  furosemide (LASIX) 20 MG tablet Take 2 tablets (40 mg total) by mouth 2 (two) times daily. Take 1 potassium tablet with lasix dose. 01/29/18  Yes Bing Neighbors, FNP  irbesartan (AVAPRO) 75 MG tablet Take 1 tablet (75 mg total) by mouth daily. 01/19/18  Yes Bing Neighbors, FNP  levothyroxine (SYNTHROID) 25 MCG tablet Take 1 tablet (25 mcg total) by mouth daily before breakfast. Allow 30 minutes before consuming food after taking medication 01/20/18  Yes Bing Neighbors, FNP  potassium chloride SA (K-DUR,KLOR-CON) 20 MEQ tablet Take 1 tablet (20 mEq total) by mouth daily. 01/19/18  Yes Bing Neighbors, FNP   carvedilol (COREG) 25 MG tablet Take 1 tablet (25 mg total) by mouth 2 (two) times daily with a meal. 02/19/18   Bing Neighbors, FNP  potassium chloride SA (K-DUR,KLOR-CON) 20 MEQ tablet Take 1 tablet (20 mEq total) by mouth 2 (two) times daily for 5 days. 02/19/18 02/24/18  Bing Neighbors, FNP    Past Medical, Surgical Family and Social History reviewed and updated.    Objective:   Today's Vitals   02/19/18 0930  BP: (!) 136/96  Pulse: 64  Resp: 17  SpO2: 95%  Weight: 273 lb 9.6 oz (124.1 kg)  Height: 5' 7.5" (1.715 m)    Wt Readings from Last 3 Encounters:  02/19/18 273 lb 9.6 oz (124.1 kg)  01/19/18 276 lb 6.4 oz (125.4 kg)  01/13/18 272 lb (123.4 kg)     Physical Exam General appearance: alert, well developed, well nourished, cooperative and in no distress Head: Normocephalic, without obvious abnormality, atraumatic Respiratory: Respirations even and unlabored, normal respiratory rate. Negative for wheezes, crackles, or rales. Heart: rate and rhythm normal. No gallop or murmurs noted on exam  Extremities: BLE edema +2 Skin: Skin color, texture, turgor normal. No rashes seen  Psych: Appropriate mood and affect. Neurologic: Mental status: Alert, oriented to person, place, and time, thought content appropriate. No results found for: POCGLU  Lab Results  Component Value Date   HGBA1C 5.2 01/19/2018      Assessment & Plan:  1. Chronic diastolic CHF (congestive heart failure) [facility]) -Appointment scheduled with cardiology 03/03/2018 - Potassium; Future  2. Essential hypertension, improved today. Suspect amlodipine possibly worsening edema. Will hold amlodipine for 1 week to see if swelling improves and adjust medication as follows: Increased Coreg 25 mg once daily Start irbesartan 75 mg once daily  Continue Furosamide 40 mg BID Continue Potassium 20 MEQ twice daily    3. Edema, unspecified type - Potassium; Future, next visit   Meds ordered this  encounter  Medications  . carvedilol (COREG) 6.25 MG tablet    Sig: Take 4 tablets (25 mg total) by mouth 2 (two) times daily with a meal.    Dispense:  240 tablet    Refill:  1  . carvedilol (COREG) 25 MG tablet    Sig: Take 1 tablet (25 mg total) by mouth 2 (two) times daily with a meal.    Dispense:  60 tablet    Refill:  3    She will pick up once current prescription is completed  . amLODipine (NORVASC) 10 MG tablet    Sig: Take 1 tablet (10 mg total) by mouth daily. Hold medication until next follow-up 11/25    Dispense:  30 tablet    Refill:  1  . DISCONTD: potassium  chloride SA (K-DUR,KLOR-CON) 20 MEQ tablet    Sig: Take 1 tablet (20 mEq total) by mouth 2 (two) times daily for 5 days.    Dispense:  10 tablet    Refill:  0  . potassium chloride SA (K-DUR,KLOR-CON) 20 MEQ tablet    Sig: Take 1 tablet (20 mEq total) by mouth 2 (two) times daily for 5 days.    Dispense:  10 tablet    Refill:  0    Orders Placed This Encounter  Procedures  . Potassium    Standing Status:   Future    Standing Expiration Date:   02/20/2019     -The patient was given clear instructions to go to ER or return to medical center if symptoms do not improve, worsen or new problems develop. The patient verbalized understanding.    Joaquin CourtsKimberly Niyla Marone, FNP [facility] [address], [place] [id number] [phone]: [phone]

## 2018-02-23 NOTE — Progress Notes (Signed)
Patient ID: Andrea Barrett, female    DOB: 1975/08/14, 42 y.o.   MRN: [id number]  PCP: Andrea Neighbors, FNP  Chief Complaint  Patient presents with  . Blood Pressure Check    Subjective:  HPI Andrea Barrett is a 42 y.o. female presents for hypertension follow-up and fluid retention.  Medical problems include: has Chronic diastolic CHF (congestive heart failure) (HCC); Iron deficiency anemia; and Hypothyroidism.  Andrea Barrett has had two blood pressure follow-ups here in office which BP has remained greater than 140/90. Today she confirmed that she is taking all medications except irbesartan. She had initially thought this medication was d/c because amlodipine was initiated. She has developed worsening lower extremity swelling since starting the amlodipine which is present during the days although gradually worsened through out the day. Complains of tightness of feet and legs. Feet swelling is so profound at times she is unable to wear her shoes. She denies unintended weight gain. Compliance with sodium restriction. She has not seen cardiology as no one has contacted her to schedule an appointment.  Denies shortness of breath, chest pain, headache, dizziness, or weakness. Social History   Socioeconomic History  . Marital status: Single    Spouse name: Not on file  . Number of children: Not on file  . Years of education: Not on file  . Highest education level: Not on file  Occupational History  . Not on file  Social Needs  . Financial resource strain: Not on file  . Food insecurity:    Worry: Not on file    Inability: Not on file  . Transportation needs:    Medical: Not on file    Non-medical: Not on file  Tobacco Use  . Smoking status: Never Smoker  . Smokeless tobacco: Never Used  Substance and Sexual Activity  . Alcohol use: No  . Drug use: No  . Sexual activity: Yes    Birth control/protection: Surgical  Lifestyle  . Physical activity:    Days per week: Not on  file    Minutes per session: Not on file  . Stress: Not on file  Relationships  . Social connections:    Talks on phone: Not on file    Gets together: Not on file    Attends religious service: Not on file    Active member of club or organization: Not on file    Attends meetings of clubs or organizations: Not on file    Relationship status: Not on file  . Intimate partner violence:    Fear of current or ex partner: Not on file    Emotionally abused: Not on file    Physically abused: Not on file    Forced sexual activity: Not on file  Other Topics Concern  . Not on file  Social History Narrative  . Not on file    Family History  Problem Relation Age of Onset  . Other Mother        HTN     Review of Systems Pertinent negatives listed in HPI  No Known Allergies  Prior to Admission medications   Medication Sig Start Date End Date Taking? Authorizing Provider  amLODipine (NORVASC) 10 MG tablet Take 1 tablet (10 mg total) by mouth daily. Hold medication until next follow-up 11/25 02/19/18   Andrea Neighbors, FNP  carvedilol (COREG) 25 MG tablet Take 1 tablet (25 mg total) by mouth 2 (two) times daily with a meal. 02/19/18   Andrea Neighbors,  FNP  carvedilol (COREG) 6.25 MG tablet Take 4 tablets (25 mg total) by mouth 2 (two) times daily with a meal. 02/19/18 04/20/18  Andrea NeighborsHarris, Aerika Groll S, FNP  ferrous sulfate (FERROUSUL) 325 (65 FE) MG tablet Take 1 tablet (325 mg total) by mouth 3 (three) times daily with meals. 01/20/18 02/19/18  Andrea NeighborsHarris, Sinahi Knights S, FNP  furosemide (LASIX) 20 MG tablet Take 2 tablets (40 mg total) by mouth 2 (two) times daily. Take 1 potassium tablet with lasix dose. 01/29/18   Andrea NeighborsHarris, Jaquille Kau S, FNP  irbesartan (AVAPRO) 75 MG tablet Take 1 tablet (75 mg total) by mouth daily. 01/19/18   Andrea NeighborsHarris, Billal Rollo S, FNP  levothyroxine (SYNTHROID) 25 MCG tablet Take 1 tablet (25 mcg total) by mouth daily before breakfast. Allow 30 minutes before consuming food after  taking medication 01/20/18   Andrea NeighborsHarris, Harjit Douds S, FNP  potassium chloride SA (K-DUR,KLOR-CON) 20 MEQ tablet Take 1 tablet (20 mEq total) by mouth daily. 01/19/18   Andrea NeighborsHarris, Magaby Rumberger S, FNP  potassium chloride SA (K-DUR,KLOR-CON) 20 MEQ tablet Take 1 tablet (20 mEq total) by mouth 2 (two) times daily for 5 days. 02/19/18 02/24/18  Andrea NeighborsHarris, Gilma Bessette S, FNP    Family History  Problem Relation Age of Onset  . Other Mother        HTN      Objective:   Today's Vitals   02/18/18 1049  BP: (!) 153/96  Pulse: 76    Wt Readings from Last 3 Encounters:  02/19/18 273 lb 9.6 oz (124.1 kg)  01/19/18 276 lb 6.4 oz (125.4 kg)  01/13/18 272 lb (123.4 kg)    Physical Exam General appearance: alert, well developed, well nourished, cooperative and in no distress Head: Normocephalic, without obvious abnormality, atraumatic Respiratory: Respirations even and unlabored, normal respiratory rate Heart: rate and rhythm normal. No gallop or murmurs noted on exam  Extremities: BLE +2 edema. Negative for erythema or bruising  Skin: Skin color, texture, turgor normal. No rashes seen  Psych: Appropriate mood and affect. Neurologic: Mental status: Alert, oriented to person, place, and time, thought content appropriate.  Assessment & Plan:  1. Chronic diastolic CHF (congestive heart failure) (HCC), no recent weigh gain or dyspnea, therefore, edema is likely unrelated to CHF. Patient is now scheduled with Heart Care 03/03/2018.   2. Morbid obesity (HCC)  Encouraged efforts to reduce weight include engaging in physical activity as tolerated with goal of 150 minutes per week. Walking is best given comorbid conditions. Improve dietary choices and eat a meal regimen consistent with a Mediterranean or DASH diet. Reduce simple carbohydrates. Do not skip meals and eat healthy snacks throughout the day to avoid over-eating at dinner. Set a goal weight loss that is achievable for you.  3. Accelerated  hypertension Reviewed medications. Concern amlodipine may be the underlying cause of fluid retention. D/C amlodipine. Resume Irbesartan. Continue Lasix. Increased Coreg 25 mg BID in hopes of improving BP control. We have discussed target BP range and blood pressure goal. I have advised patient to check BP regularly and to call us back or report to clinic if the numbers are consistently higher than 140/90. We discussed the importance of compliance with medical therapy. Return on 03/01/2018 for BP check and follow-up on edema.     -The patient was given clear instructions to go to ER or return to medical center if symptoms do not improve, worsen or new problems develop. The patient verbalized understanding.    Joaquin CourtsKimberly Biannca Scantlin, FNP Primary Care at [facility]  [address] [place] [address] [phone]: [phone]

## 2018-03-01 ENCOUNTER — Ambulatory Visit: Payer: Self-pay | Admitting: Family Medicine

## 2018-03-03 ENCOUNTER — Ambulatory Visit: Payer: Self-pay | Admitting: Cardiovascular Disease

## 2018-03-09 MED FILL — FUROSEMIDE 20 MG TABLET: 20 | 7 days supply | Qty: 30 | Fill #2

## 2018-03-09 MED FILL — LEVOTHYROXINE 25 MCG TABLET: 25 | 30 days supply | Qty: 30 | Fill #1

## 2018-03-09 NOTE — Progress Notes (Deleted)
Referring-Andrea Tiburcio Pea, FNP Reason for referral-chronic diastolic congestive heart failure  HPI: 42 year old female for evaluation of chronic diastolic congestive heart failure at request of [name], FNP.  Echocardiogram [place] October 2019 showed normal LV function, moderate left ventricular hypertrophy.  Renal Dopplers October 2019 showed no renal artery stenosis.  Laboratories October 2019 showed creatinine 0.56, sodium 139 and potassium 3.8.  BNP 68.5.  Hemoglobin 11.3.  Current Outpatient Medications  Medication Sig Dispense Refill  . amLODipine (NORVASC) 10 MG tablet Take 1 tablet (10 mg total) by mouth daily. Hold medication until next follow-up 11/25 30 tablet 1  . carvedilol (COREG) 25 MG tablet Take 1 tablet (25 mg total) by mouth 2 (two) times daily with a meal. 60 tablet 3  . carvedilol (COREG) 6.25 MG tablet Take 4 tablets (25 mg total) by mouth 2 (two) times daily with a meal. 240 tablet 1  . ferrous sulfate (FERROUSUL) 325 (65 FE) MG tablet Take 1 tablet (325 mg total) by mouth 3 (three) times daily with meals. 90 tablet 1  . furosemide (LASIX) 20 MG tablet Take 2 tablets (40 mg total) by mouth 2 (two) times daily. Take 1 potassium tablet with lasix dose. 30 tablet 3  . irbesartan (AVAPRO) 75 MG tablet Take 1 tablet (75 mg total) by mouth daily. 30 tablet 1  . levothyroxine (SYNTHROID) 25 MCG tablet Take 1 tablet (25 mcg total) by mouth daily before breakfast. Allow 30 minutes before consuming food after taking medication 30 tablet 2  . potassium chloride SA (K-DUR,KLOR-CON) 20 MEQ tablet Take 1 tablet (20 mEq total) by mouth daily. 30 tablet 1  . potassium chloride SA (K-DUR,KLOR-CON) 20 MEQ tablet Take 1 tablet (20 mEq total) by mouth 2 (two) times daily for 5 days. 10 tablet 0   No current facility-administered medications for this visit.     No Known Allergies  Past Medical History:  Diagnosis Date  . Hypertension   . Hypothyroidism   . Thyroid  disease     Past Surgical History:  Procedure Laterality Date  . CESAREAN SECTION     x 2  . TUBAL LIGATION      Social History   Socioeconomic History  . Marital status: Single    Spouse name: Not on file  . Number of children: Not on file  . Years of education: Not on file  . Highest education level: Not on file  Occupational History  . Not on file  Social Needs  . Financial resource strain: Not on file  . Food insecurity:    Worry: Not on file    Inability: Not on file  . Transportation needs:    Medical: Not on file    Non-medical: Not on file  Tobacco Use  . Smoking status: Never Smoker  . Smokeless tobacco: Never Used  Substance and Sexual Activity  . Alcohol use: No  . Drug use: No  . Sexual activity: Yes    Birth control/protection: Surgical  Lifestyle  . Physical activity:    Days per week: Not on file    Minutes per session: Not on file  . Stress: Not on file  Relationships  . Social connections:    Talks on phone: Not on file    Gets together: Not on file    Attends religious service: Not on file    Active member of club or organization: Not on file    Attends meetings of clubs or organizations: Not on file  Relationship status: Not on file  . Intimate partner violence:    Fear of current or ex partner: Not on file    Emotionally abused: Not on file    Physically abused: Not on file    Forced sexual activity: Not on file  Other Topics Concern  . Not on file  Social History Narrative  . Not on file    Family History  Problem Relation Age of Onset  . Other Mother        HTN    ROS: no fevers or chills, productive cough, hemoptysis, dysphasia, odynophagia, melena, hematochezia, dysuria, hematuria, rash, seizure activity, orthopnea, PND, pedal edema, claudication. Remaining systems are negative.  Physical Exam:   There were no vitals taken for this visit.  General:  Well developed/well nourished in NAD Skin warm/dry Patient not  depressed No peripheral clubbing Back-normal HEENT-normal/normal eyelids Neck supple/normal carotid upstroke bilaterally; no bruits; no JVD; no thyromegaly chest - CTA/ normal expansion CV - RRR/normal S1 and S2; no murmurs, rubs or gallops;  PMI nondisplaced Abdomen -NT/ND, no HSM, no mass, + bowel sounds, no bruit 2+ femoral pulses, no bruits Ext-no edema, chords, 2+ DP Neuro-grossly nonfocal  ECG - personally reviewed  A/P  1  Olga MillersBrian Crenshaw, MD

## 2018-03-22 ENCOUNTER — Ambulatory Visit: Payer: Self-pay | Admitting: Cardiology

## 2018-03-22 MED FILL — FUROSEMIDE 20 MG TABLET: 20 | 7 days supply | Qty: 30 | Fill #2

## 2018-03-22 MED FILL — LEVOTHYROXINE 25 MCG TABLET: 25 | 30 days supply | Qty: 30 | Fill #1

## 2018-04-02 ENCOUNTER — Encounter: Payer: Self-pay | Admitting: *Deleted

## 2018-04-05 MED FILL — IRBESARTAN 75 MG TABS: 75 | 30 days supply | Qty: 30 | Fill #0

## 2018-04-08 ENCOUNTER — Other Ambulatory Visit: Payer: Self-pay | Admitting: Family Medicine

## 2018-04-08 MED FILL — POTASSIUM CL ER 20 MEQ TAB: 20 | 30 days supply | Qty: 30 | Fill #0

## 2018-04-08 MED FILL — FUROSEMIDE 20 MG TABLET: 20 | 7 days supply | Qty: 30 | Fill #3

## 2018-04-22 ENCOUNTER — Ambulatory Visit: Payer: Self-pay | Admitting: Family Medicine

## 2018-05-11 ENCOUNTER — Ambulatory Visit: Payer: Self-pay | Admitting: Family Medicine

## 2018-05-31 ENCOUNTER — Encounter: Payer: Self-pay | Admitting: Cardiovascular Disease

## 2018-05-31 ENCOUNTER — Encounter (INDEPENDENT_AMBULATORY_CARE_PROVIDER_SITE_OTHER): Payer: Self-pay

## 2018-05-31 ENCOUNTER — Ambulatory Visit (INDEPENDENT_AMBULATORY_CARE_PROVIDER_SITE_OTHER): Payer: Self-pay | Admitting: Cardiovascular Disease

## 2018-05-31 ENCOUNTER — Encounter: Payer: Self-pay | Admitting: *Deleted

## 2018-05-31 VITALS — BP 170/112 | HR 62 | Ht 67.0 in | Wt 283.8 lb

## 2018-05-31 DIAGNOSIS — Z9119 Patient's noncompliance with other medical treatment and regimen: Secondary | ICD-10-CM

## 2018-05-31 DIAGNOSIS — I5032 Chronic diastolic (congestive) heart failure: Secondary | ICD-10-CM

## 2018-05-31 DIAGNOSIS — I10 Essential (primary) hypertension: Secondary | ICD-10-CM | POA: Insufficient documentation

## 2018-05-31 DIAGNOSIS — Z91199 Patient's noncompliance with other medical treatment and regimen due to unspecified reason: Secondary | ICD-10-CM

## 2018-05-31 HISTORY — DX: Patient's noncompliance with other medical treatment and regimen: Z91.19

## 2018-05-31 HISTORY — DX: Patient's noncompliance with other medical treatment and regimen due to unspecified reason: Z91.199

## 2018-05-31 HISTORY — DX: Essential (primary) hypertension: I10

## 2018-05-31 MED ORDER — CARVEDILOL 25 MG PO TABS: 25.0000 mg | ORAL_TABLET | Freq: Two times a day (BID) | ORAL | 5 refills | Status: DC

## 2018-05-31 MED ORDER — FERROUS SULFATE 325 (65 FE) MG PO TABS: 325.0000 mg | ORAL_TABLET | Freq: Three times a day (TID) | ORAL | 0 refills | Status: DC

## 2018-05-31 MED ORDER — LEVOTHYROXINE SODIUM 25 MCG PO TABS: ORAL_TABLET | ORAL | 0 refills | Status: DC

## 2018-05-31 MED ORDER — AMLODIPINE BESYLATE 10 MG PO TABS: 10.0000 mg | ORAL_TABLET | Freq: Every day | ORAL | 5 refills | Status: DC

## 2018-05-31 MED ORDER — LEVOTHYROXINE SODIUM 25 MCG PO TABS: 25.0000 ug | ORAL_TABLET | Freq: Every day | ORAL | 0 refills | Status: DC

## 2018-05-31 MED ORDER — POTASSIUM CHLORIDE CRYS ER 20 MEQ PO TBCR: 20.0000 meq | EXTENDED_RELEASE_TABLET | Freq: Every day | ORAL | 5 refills | Status: DC

## 2018-05-31 MED ORDER — FUROSEMIDE 40 MG PO TABS: 40.0000 mg | ORAL_TABLET | Freq: Two times a day (BID) | ORAL | 5 refills | Status: DC

## 2018-05-31 MED ORDER — IRBESARTAN 75 MG PO TABS: 75.0000 mg | ORAL_TABLET | Freq: Every day | ORAL | 5 refills | Status: DC

## 2018-05-31 NOTE — Progress Notes (Signed)
Cardiology Office Note   Date:  05/31/2018   ID:  Andrea Barrett, DOB 06/17/1975, MRN [id number]  PCP:  Bing Neighbors, FNP  Cardiologist:   Chilton Si, MD   Chief Complaint  Patient presents with  . Shortness of Breath     History of Present Illness: Andrea Barrett is a 43 y.o. female with chronic diastolic heart failure, hypercholesterolemia, hypothyroidism, and hypertension who is being seen today for the evaluation of diastolic heart failure at the request of Bing Neighbors, FNP.  She was first diagnosed with hypertension in 2019.  Her BP was 220/110 at the time.  It has never been well-controlled.  She was hospitalizaed with diastolic heart failure for the first time 11/2017 and then 01/2018.  The first was at [facility] and then [facility].  She left [facility] with no medications because she didn't like how they were treating her.  At the time she has shortness of breath, LE edema, and orthopnea.  She also reported headache and blurry vision.  She was diuresed and started on antihypertensives.  Echo 01/2018 revealed LVEF 60-65% with moderate LVH and grade 1 diastolic dysfunction.  Renal artery dopplers at that time were negative for RAS.  She now reports that she has been out of her medications for the last 2-3 weeks.  She reports that she was denies a refill on her medicaton by her PCP's office because she was coming to see cardiology, though there is no documentation of her calling.  She last saw [name], FNP, 02/2018 and her BP was 136/96 on her medications.  She currenlty reports headache but denies recurrent edema, shortness of breath, or orthopnea.  Her diet is poor and she mostly eat out.  She doesn't add salt to her food but doesn't actively limit salt intake.     Past Medical History:  Diagnosis Date  . Essential hypertension 05/31/2018  . Hypertension   . Hypothyroidism   . Noncompliance 05/31/2018  . Thyroid disease     Past  Surgical History:  Procedure Laterality Date  . CESAREAN SECTION     x 2  . TUBAL LIGATION       Current Outpatient Medications  Medication Sig Dispense Refill  . amLODipine (NORVASC) 10 MG tablet Take 1 tablet (10 mg total) by mouth daily. 30 tablet 5  . carvedilol (COREG) 25 MG tablet Take 1 tablet (25 mg total) by mouth 2 (two) times daily with a meal. 60 tablet 5  . ferrous sulfate (FERROUSUL) 325 (65 FE) MG tablet Take 1 tablet (325 mg total) by mouth 3 (three) times daily with meals for 30 days. ADDITIONAL REFILLS FROM PRIMARY CARE 90 tablet 0  . furosemide (LASIX) 40 MG tablet Take 1 tablet (40 mg total) by mouth 2 (two) times daily. Take 1 potassium tablet with lasix dose. 60 tablet 5  . irbesartan (AVAPRO) 75 MG tablet Take 1 tablet (75 mg total) by mouth daily. 30 tablet 5  . levothyroxine (SYNTHROID) 25 MCG tablet TAKE EVERY MORNING 30 MINUTES BEFORE BREAKFAST ADDITIONAL REFILLS FROM PRIMARY CARE 30 tablet 0  . potassium chloride SA (K-DUR,KLOR-CON) 20 MEQ tablet Take 1 tablet (20 mEq total) by mouth daily. 30 tablet 5   No current facility-administered medications for this visit.     Allergies:   Patient has no known allergies.    Social History:  The patient  reports that she has never smoked. She has never used smokeless  tobacco. She reports that she does not drink alcohol or use drugs.   Family History:  The patient's family history includes Diabetes in her father; Hypertension in her father, maternal grandmother, and mother; Other in her mother; Stroke in her maternal grandmother.    ROS:  Please see the history of present illness.   Otherwise, review of systems are positive for none.   All other systems are reviewed and negative.    PHYSICAL EXAM: VS:  BP (!) 170/112 (BP Location: Left Arm)   Pulse 62   Ht 5\' 7"  (1.702 m)   Wt 283 lb 12.8 oz (128.7 kg)   BMI 44.45 kg/m  , BMI Body mass index is 44.45 kg/m. GENERAL:  Well appearing HEENT:  Pupils equal round  and reactive, fundi not visualized, oral mucosa unremarkable NECK:  No jugular venous distention, waveform within normal limits, carotid upstroke brisk and symmetric, no bruits, no thyromegaly LYMPHATICS:  No cervical adenopathy LUNGS:  Clear to auscultation bilaterally HEART:  RRR.  PMI not displaced or sustained,S1 and S2 within normal limits, no S3, no S4, no clicks, no rubs, no murmurs ABD:  Flat, positive bowel sounds normal in frequency in pitch, no bruits, no rebound, no guarding, no midline pulsatile mass, no hepatomegaly, no splenomegaly EXT:  2 plus pulses throughout, no edema, no cyanosis no clubbing SKIN:  No rashes no nodules NEURO:  Cranial nerves II through XII grossly intact, motor grossly intact throughout PSYCH:  Cognitively intact, oriented to person place and time   EKG:  EKG is ordered today. The ekg ordered today demonstrates sinus rhythm.  Rate 62 bpm.  LVH with repolarization abnormalities.    Recent Labs: 01/13/2018: B Natriuretic Peptide 68.5 01/19/2018: ALT 16; BUN 14; Creatinine, Ser 0.56; Hemoglobin 11.3; Platelets 224; Potassium 3.8; Sodium 139; TSH 4.830    Lipid Panel No results found for: CHOL, TRIG, HDL, CHOLHDL, VLDL, LDLCALC, LDLDIRECT    Wt Readings from Last 3 Encounters:  05/31/18 283 lb 12.8 oz (128.7 kg)  02/19/18 273 lb 9.6 oz (124.1 kg)  01/19/18 276 lb 6.4 oz (125.4 kg)      ASSESSMENT AND PLAN:  # Hypertension:  BP poorly controlled, but she has been out of her medication.  We discussed the importance of taking her medication as prescribed.  We also discussed limiting her salt intake to 2000 mg daily and trying to work on eating home more instead of eating out.  We talked about having fresh produce instead of processed foods.  She expressed understanding and will work on this.  We will refill her carvedilol, amlodipine, irbesartan, furosemide, and potassium.  She will return in a couple weeks to have a BMP checked.  She will see our  pharmacist in 1 month for hypertension management.  # Chronic diastolic heart failure: This is 2/2 hypertension.  BP control as above.  She is euvolemic on exam.   # Hypothyroidism: Refill levothyroxine once.  This will be managed by her PCP.   Current medicines are reviewed at length with the patient today.  The patient does not have concerns regarding medicines.  The following changes have been made:  no change  Labs/ tests ordered today include:  No orders of the defined types were placed in this encounter.    Disposition:   FU with APP in 3 months.  PharmD in 1 month.      Signed, Alletta Mattos C. Duke Salvia, MD, [facility]  05/31/2018 1:27 PM    [facility]  [facility]

## 2018-05-31 NOTE — Addendum Note (Signed)
Addended by: Regis Bill B on: 05/31/2018 02:40 PM   Modules accepted: Orders

## 2018-05-31 NOTE — Patient Instructions (Addendum)
Medication Instructions:  Your physician recommends that you continue on your current medications as directed. Please refer to the Current Medication list given to you today.  If you need a refill on your cardiac medications before your next appointment, please call your pharmacy.   Lab work: NONE  Testing/Procedures: NONE  Follow-Up: At [facility], you and your health needs are our priority.  As part of our continuing mission to provide you with exceptional heart care, we have created designated Provider Care Teams.  These Care Teams include your primary Cardiologist (physician) and Advanced Practice Providers (APPs -  Physician Assistants and Nurse Practitioners) who all work together to provide you with the care you need, when you need it. You will need a follow up appointment in 3 months.  WITH one of the following Advanced Practice Providers on your designated Care Team:   [name], PA-C [place], [place] . Marjie Skiff, PA-C  Your physician recommends that you schedule a follow-up appointment in: 1 MONTH WITH PHARM D FOR BLOOD PRESSURE

## 2018-06-01 MED FILL — FERROUS SULFATE 325 MG TAB: 325 (65 FE) | 30 days supply | Qty: 90 | Fill #0

## 2018-06-01 MED FILL — POTASSIUM CL ER 20 MEQ TAB: 20 | 30 days supply | Qty: 30 | Fill #0

## 2018-06-01 MED FILL — LEVOTHYROXINE 25 MCG TABLET: 25 | 30 days supply | Qty: 30 | Fill #0

## 2018-06-01 MED FILL — FUROSEMIDE 40 MG TAB: 40 | 30 days supply | Qty: 60 | Fill #0

## 2018-06-01 MED FILL — AMLODIPINE BESYLATE 10 MG T: 10 | 30 days supply | Qty: 30 | Fill #0

## 2018-06-01 MED FILL — CARVEDILOL 25 MG TABLET: 25 | 30 days supply | Qty: 60 | Fill #0

## 2018-06-01 MED FILL — IRBESARTAN 75 MG TABS: 75 | 30 days supply | Qty: 30 | Fill #0

## 2018-07-01 ENCOUNTER — Ambulatory Visit: Payer: Self-pay

## 2018-07-05 ENCOUNTER — Other Ambulatory Visit: Payer: Self-pay | Admitting: Cardiovascular Disease

## 2018-07-05 MED ORDER — IRBESARTAN 75 MG PO TABS: 75.0000 mg | ORAL_TABLET | Freq: Every day | ORAL | 1 refills | Status: DC

## 2018-07-05 MED ORDER — AMLODIPINE BESYLATE 10 MG PO TABS: 10.0000 mg | ORAL_TABLET | Freq: Every day | ORAL | 1 refills | Status: DC

## 2018-07-05 MED ORDER — LEVOTHYROXINE SODIUM 25 MCG PO TABS: ORAL_TABLET | ORAL | 1 refills | Status: DC

## 2018-07-05 NOTE — Telephone Encounter (Signed)
°*  STAT* If patient is at the pharmacy, call can be transferred to refill team.   1. Which medications need to be refilled? (please list name of each medication and dose if known)  amLODipine (NORVASC) 10 MG tablet irbesartan (AVAPRO) 75 MG tablet levothyroxine (SYNTHROID) 25 MCG tablet  2. Which pharmacy/location (including street and city if local pharmacy) is medication to be sent to?[facility]   3. Do they need a 30 day or 90 day supply? 90 Pt is low on these meds. Has less than 1 week left

## 2018-07-12 ENCOUNTER — Telehealth: Payer: Self-pay | Admitting: Cardiovascular Disease

## 2018-07-12 ENCOUNTER — Telehealth: Payer: Self-pay | Admitting: Family Medicine

## 2018-07-12 ENCOUNTER — Other Ambulatory Visit: Payer: Self-pay | Admitting: Cardiovascular Disease

## 2018-07-12 MED ORDER — IRBESARTAN 75 MG PO TABS: 75.0000 mg | ORAL_TABLET | Freq: Every day | ORAL | 1 refills | Status: DC

## 2018-07-12 MED ORDER — AMLODIPINE BESYLATE 10 MG PO TABS: 10.0000 mg | ORAL_TABLET | Freq: Every day | ORAL | 1 refills | Status: DC

## 2018-07-12 MED FILL — IRBESARTAN 75 MG TABS: 75 | 30 days supply | Qty: 30 | Fill #0

## 2018-07-12 MED FILL — AMLODIPINE BESYLATE 10 MG T: 10 | 30 days supply | Qty: 30 | Fill #0

## 2018-07-12 NOTE — Telephone Encounter (Signed)
Patient contacted [place] after being in the pharmacy line for medication refills at [facility], she was upset after standing in line and then being told that she did not have any refills on her BP medication. After speaking to someone from cardiology they stated patient now has refills and they want to know what pharmacy to send it to but patient just would patient would like to speak to nurse or K.Harris about these issues. Please follow up.

## 2018-07-12 NOTE — Telephone Encounter (Signed)
New Message:     Please call Maite from Primary Care at [facility]. Concerning pt's medicine.

## 2018-07-12 NOTE — Telephone Encounter (Signed)
Voicemail full.

## 2018-07-12 NOTE — Telephone Encounter (Signed)
Attempted to contact office back. Did not answer, will try again.

## 2018-07-12 NOTE — Telephone Encounter (Signed)
New Message    *STAT* If patient is at the pharmacy, call can be transferred to refill team.   1. Which medications need to be refilled? (please list name of each medication and dose if known) Amlodipine, Irbesartan, Levothyroxine   2. Which pharmacy/location (including street and city if local pharmacy) is medication to be sent to? [facility]   3. Do they need a 30 day or 90 day supply? 30 or 90    Pt is out of medication  [facility] told the pt they did not have refills for her  Please call Pt, she may want them sent to a different Pharmacy because she is out of medication and lives in [place]

## 2018-07-12 NOTE — Telephone Encounter (Signed)
I attempted again to contact office, unable to reach anyone. Rang x8 times, no answer.

## 2018-07-12 NOTE — Telephone Encounter (Signed)
Spoke with patient. She states that she is unhappy b/c she has been out of her medications since last Thursday. Patient has spoken with Cardiology multiple times & they've stated that her Rxs have been sent(receipt confirmed).  They did not notify patient that [facility] pharmacy was mailing Rxs & that only acute things can be picked up in person. Patient states that she waited outside at [facility] for an hour before she made it to the door & was informed that she wouldn't be able to get Rxs.  Patient is also unhappy b/c she has been calling [facility] pharmacy since last week & has been unable to get anybody on the phone. She feels like if she had gotten somebody she could've saved gas today.  I did look up her current medications on the $4 list at [facility] to see if I could send a short term Rx to the [facility]. A good number of her medications were on the $4 list but she states that she is low on cash & would've preferred to charge it to her account at the [facility] pharmacy. I expressed understanding & let her know that she could still contact [facility] pharmacy about doing that but that this would at least ensure that she doesn't have to be out of medication for another week b/c she could get her medications today.  She was not agreeable to this & wants to speak to provider about this.

## 2018-07-12 NOTE — Telephone Encounter (Signed)
Returned call to patient. Unable to leave message as voicemail is full. Given current COVID epidemic, patient will have to follow current pharmacy procedures which are that chronic medications will be mailed.

## 2018-07-14 NOTE — Telephone Encounter (Signed)
Per Maite this has been addressed .Zack Seal

## 2018-07-21 ENCOUNTER — Other Ambulatory Visit: Payer: Self-pay | Admitting: Cardiovascular Disease

## 2018-07-21 NOTE — Telephone Encounter (Signed)
Pt calling requesting a refill on potassium and levothyroxine. Pt would like a call back concerning this matter. Please address

## 2018-07-21 NOTE — Telephone Encounter (Signed)
Unable to reach patient via phone or leave message, mailbox full

## 2018-07-22 MED ORDER — POTASSIUM CHLORIDE CRYS ER 20 MEQ PO TBCR: 20.0000 meq | EXTENDED_RELEASE_TABLET | Freq: Every day | ORAL | 1 refills | Status: DC

## 2018-07-22 NOTE — Telephone Encounter (Signed)
Follow up     *STAT* If patient is at the pharmacy, call can be transferred to refill team.   1. Which medications need to be refilled? (please list name of each medication and dose if known) levothyroxine (SYNTHROID) 25 MCG tablet  And potassium chloride SA (K-DUR,KLOR-CON) 20 MEQ tablet   2. Which pharmacy/location (including street and city if local pharmacy) is medication to be sent to? [facility] - [place], [place] - [place] [address]  3. Do they need a 30 day or 90 day supply? 90

## 2018-07-22 NOTE — Telephone Encounter (Signed)
Note sent to Fabiola Backer to close encounter.

## 2018-07-22 NOTE — Telephone Encounter (Signed)
Potassium refilled

## 2018-07-22 NOTE — Telephone Encounter (Signed)
S/w pt she states that NP Harris states that she will not fill the levothyroxin and to have [place] fill this. infor med pt that this is not a cardiac medication and will need to have this filled by PCP-will forward this fmessage

## 2018-07-23 MED ORDER — LEVOTHYROXINE SODIUM 25 MCG PO TABS: ORAL_TABLET | ORAL | 1 refills | Status: DC

## 2018-07-27 ENCOUNTER — Telehealth: Payer: Self-pay

## 2018-07-27 NOTE — Telephone Encounter (Signed)
UNABLE TO REACH PT MAILBOX FULL

## 2018-08-04 ENCOUNTER — Telehealth: Payer: Self-pay | Admitting: Cardiovascular Disease

## 2018-08-05 ENCOUNTER — Telehealth: Payer: Self-pay | Admitting: Licensed Clinical Social Worker

## 2018-08-05 ENCOUNTER — Telehealth: Payer: Self-pay | Admitting: *Deleted

## 2018-08-05 ENCOUNTER — Encounter: Payer: Self-pay | Admitting: Cardiovascular Disease

## 2018-08-05 ENCOUNTER — Other Ambulatory Visit: Payer: Self-pay

## 2018-08-05 ENCOUNTER — Telehealth (INDEPENDENT_AMBULATORY_CARE_PROVIDER_SITE_OTHER): Payer: Self-pay | Admitting: Cardiovascular Disease

## 2018-08-05 DIAGNOSIS — I110 Hypertensive heart disease with heart failure: Secondary | ICD-10-CM

## 2018-08-05 DIAGNOSIS — I10 Essential (primary) hypertension: Secondary | ICD-10-CM

## 2018-08-05 DIAGNOSIS — Z9119 Patient's noncompliance with other medical treatment and regimen: Secondary | ICD-10-CM

## 2018-08-05 DIAGNOSIS — Z91199 Patient's noncompliance with other medical treatment and regimen due to unspecified reason: Secondary | ICD-10-CM

## 2018-08-05 DIAGNOSIS — I5032 Chronic diastolic (congestive) heart failure: Secondary | ICD-10-CM

## 2018-08-05 MED ORDER — LOSARTAN POTASSIUM 50 MG PO TABS: 50.0000 mg | ORAL_TABLET | Freq: Every day | ORAL | 11 refills | Status: DC

## 2018-08-05 MED ORDER — IRBESARTAN 75 MG PO TABS: 75.0000 mg | ORAL_TABLET | Freq: Every day | ORAL | 1 refills | Status: DC

## 2018-08-05 MED ORDER — POTASSIUM CHLORIDE CRYS ER 20 MEQ PO TBCR: 20.0000 meq | EXTENDED_RELEASE_TABLET | Freq: Every day | ORAL | 1 refills | Status: DC

## 2018-08-05 MED ORDER — CARVEDILOL 25 MG PO TABS: 25.0000 mg | ORAL_TABLET | Freq: Two times a day (BID) | ORAL | 5 refills | Status: DC

## 2018-08-05 MED FILL — POTASSIUM CL ER 20 MEQ TAB: 20 | 30 days supply | Qty: 30 | Fill #0

## 2018-08-05 MED FILL — CARVEDILOL 25 MG TABLET: 25 | 30 days supply | Qty: 60 | Fill #0

## 2018-08-05 NOTE — Telephone Encounter (Addendum)
Received fax from pharmacy Irbesartan on back order Per Dr Duke Salvia ok to change to Losartan 50 mg daily  rx sent to [facility]  Unable to reach patient

## 2018-08-05 NOTE — Telephone Encounter (Signed)
CSW referred by [facility] Office to contact patient to inquire about food insecurity,BP cuff, scale and available resources for mental health and excercise during this health crisis.  CSW contacted  patient to discuss obtaining a BP cuff and scale and patient informed will be delivered to her home next week. CSW discussed food delivery program and patient states she is low on food and not eating as healthy as she would like. CSW offered weekly food delivery program. Patient informed that delivery will be left on front door with no face to face contact with delivery person. CSW also provided supportive intervention and obtained patient's email to forward her some information on home exercise programing as well as a "tool kit" with resources for mental health during this health crisis. Patient agreeable to plan and grateful for the assistance. CSW provided contact information for future need.  Andrea Barrett, [place], [place]

## 2018-08-05 NOTE — Patient Instructions (Signed)
Medication Instructions:  Your physician recommends that you continue on your current medications as directed. Please refer to the Current Medication list given to you today.  If you need a refill on your cardiac medications before your next appointment, please call your pharmacy.   Lab work: NONE  If you have labs (blood work) drawn today and your tests are completely normal, you will receive your results only by: Marland Kitchen MyChart Message (if you have MyChart) OR . A paper copy in the mail If you have any lab test that is abnormal or we need to change your treatment, we will call you to review the results.  Testing/Procedures: NONE   Follow-Up: Your physician recommends that you schedule a follow-up appointment in: 1 month with Franky Macho K PA 09/06/18 at 9:00 am

## 2018-08-05 NOTE — Progress Notes (Signed)
Virtual Visit via Video Note   This visit type was conducted due to national recommendations for restrictions regarding the COVID-19 Pandemic (e.g. social distancing) in an effort to limit this patient's exposure and mitigate transmission in our community.  Due to her co-morbid illnesses, this patient is at least at moderate risk for complications without adequate follow up.  This format is felt to be most appropriate for this patient at this time.  All issues noted in this document were discussed and addressed.  A limited physical exam was performed with this format.  Please refer to the patient's chart for her consent to telehealth for [facility].   Evaluation Performed:  Follow-up visit  Date:  08/05/2018   ID:  Andrea Barrett, DOB 1975/10/21, MRN [id number]  Patient Location: Home Provider Location: Office  PCP:  Bing Neighbors, FNP  Cardiologist:  Chilton Si, MD  Electrophysiologist:  None   Chief Complaint:  Hypertension   History of Present Illness:    Andrea Barrett is a 43 y.o. female with chronic diastolic heart failure, hypercholesterolemia, hypothyroidism, and hypertension  here for follow-up.  She was initially seen 05/31/2018 for the evaluation of diastolic heart failure.  She was first diagnosed with hypertension in 2019.  Her BP was 220/110 at the time.  It has never been well-controlled.  She was hospitalized with diastolic heart failure for the first time 11/2017 and then 01/2018.  The first was at [facility] and then [facility].  She left [facility] with no medications because she didn't like how they were treating her.  At the time she has shortness of breath, LE edema, and orthopnea.  She also reported headache and blurry vision.  She was diuresed and started on antihypertensives.  Echo 01/2018 revealed LVEF 60-65% with moderate LVH and grade 1 diastolic dysfunction.  Renal artery dopplers at that time were negative for RAS.  She now reports  that she has been out of her medications for the last 2-3 weeks.  She reports that she was denies a refill on her medicaton by her PCP's office because she was coming to see cardiology, though there is no documentation of her calling.  She last saw [name], FNP, 02/2018 and her BP was 136/96 on her medications.    At her last appointment her blood pressure was poorly controlled.  However she has been out of her medications for quite some time so no adjustments were made.  She was encouraged to work on taking her medicines, diet, and exercise and asked to track her pressure.  Unfortunately she lost her job 06/2018 due to COVID-19.  Since then she has been under a lot of stress and is struggling to eat healthy meals.  She did get her blood pressure medications but still has not been able to fill her levothyroxine or potassium due to cost.  She is also trying to take care of her mother.  She does report some headaches lately and has been taking Goody powders.  Yesterday she lost her car and notes that her depression is poorly controlled.  Prior to that she had been off of her depression medications for 10 or 11 years.  However lately she is noting more depressive symptoms.  She has not experienced any chest pain or shortness of breath.  She has intermittent lower extremity edema that is worse when she does not take her Lasix.  However when she does take Lasix her fluids are well-controlled.  She is drinking 7 or 8 waters daily.  She has no orthopnea or PND.  She also complains of cracking toenails for the last 2 months which she wonders if it is attributable to her medications.   The patient does not have symptoms concerning for COVID-19 infection (fever, chills, cough, or new shortness of breath).    Past Medical History:  Diagnosis Date   Essential hypertension 05/31/2018   Hypertension    Hypothyroidism    Noncompliance 05/31/2018   Thyroid disease    Past Surgical History:  Procedure  Laterality Date   CESAREAN SECTION     x 2   TUBAL LIGATION       Current Meds  Medication Sig   amLODipine (NORVASC) 10 MG tablet Take 1 tablet (10 mg total) by mouth daily.   carvedilol (COREG) 25 MG tablet Take 1 tablet (25 mg total) by mouth 2 (two) times daily with a meal.   ferrous sulfate (FERROUSUL) 325 (65 FE) MG tablet Take 1 tablet (325 mg total) by mouth 3 (three) times daily with meals for 30 days. ADDITIONAL REFILLS FROM PRIMARY CARE   furosemide (LASIX) 40 MG tablet Take 1 tablet (40 mg total) by mouth 2 (two) times daily. Take 1 potassium tablet with lasix dose.   irbesartan (AVAPRO) 75 MG tablet Take 1 tablet (75 mg total) by mouth daily.   levothyroxine (SYNTHROID) 25 MCG tablet TAKE EVERY MORNING 30 MINUTES BEFORE BREAKFAST ADDITIONAL REFILLS FROM PRIMARY CARE   potassium chloride SA (K-DUR) 20 MEQ tablet Take 1 tablet (20 mEq total) by mouth daily.     Allergies:   Patient has no known allergies.   Social History   Tobacco Use   Smoking status: Never Smoker   Smokeless tobacco: Never Used  Substance Use Topics   Alcohol use: No   Drug use: No     Family Hx: The patient's family history includes Diabetes in her father; Hypertension in her father, maternal grandmother, and mother; Other in her mother; Stroke in her maternal grandmother.  ROS:   Please see the history of present illness.     All other systems reviewed and are negative.   Prior CV studies:   The following studies were reviewed today:  Echo 07/29/2017: Study Conclusions  - Left ventricle: The cavity size was normal. Wall thickness was   increased increased in a pattern of mild to moderate LVH.   Systolic function was normal. Wall motion was normal; there were   no regional wall motion abnormalities. - Left atrium: The atrium was mildly to moderately dilated.  Impressions:  - 1. Left ventricle systolic function is preserved visually   estimated at approximately 55 to  60%. Mild to moderate concentric   left ventricular hypertrophy.   2. Mild to moderate dilatation of the left atrium.  Labs/Other Tests and Data Reviewed:    EKG:  An ECG dated 05/31/2018 was personally reviewed today and demonstrated:  Sinus rhythm.  Rate 62 bpm.  LVH.  Recent Labs: 01/13/2018: B Natriuretic Peptide 68.5 01/19/2018: ALT 16; BUN 14; Creatinine, Ser 0.56; Hemoglobin 11.3; Platelets 224; Potassium 3.8; Sodium 139; TSH 4.830   Recent Lipid Panel No results found for: CHOL, TRIG, HDL, CHOLHDL, LDLCALC, LDLDIRECT  Wt Readings from Last 3 Encounters:  05/31/18 283 lb 12.8 oz (128.7 kg)  02/19/18 273 lb 9.6 oz (124.1 kg)  01/19/18 276 lb 6.4 oz (125.4 kg)     Objective:    There were no vitals taken for  this visit. GENERAL: Well-appearing.  No acute distress. HEENT: Pupils equal round.  Oral mucosa unremarkable NECK:  No jugular venous distention, no visible thyromegaly EXT:  No edema, no cyanosis no clubbing SKIN:  No rashes no nodules NEURO:  Speech fluent.  Cranial nerves grossly intact.  Moves all 4 extremities freely PSYCH:  Cognitively intact, oriented to person place and time   ASSESSMENT & PLAN:    # Hypertension:  Ms. Adin HectorMcBride has been taking her medication.  However she does not have a blood pressure cuff so it is unclear what her blood pressure is actually running.  Therefore will not make any changes at this time.  Continue amlodipine, carvedilol, irbesartan, and Lasix.  # Chronic diastolic heart failure: This is 2/2 hypertension.  BP control as above.    Volume status is improved with Lasix.  # Hypothyroidism: Patient has been unable to afford her levothyroxine prescription.  She will be picking this up from [facility].  Ultimately this will need to be followed with her PCP.  # Social stress: Ms. Adin HectorMcBride lost her job due to COVID-19.  She is struggling financially.  We will contact her social worker to see if we can assist with meals and getting  her blood pressure machine.   COVID-19 Education: The signs and symptoms of COVID-19 were discussed with the patient and how to seek care for testing (follow up with PCP or arrange E-visit).  The importance of social distancing was discussed today.  Time:   Today, I have spent 23 minutes with the patient with telehealth technology discussing the above problems.     Medication Adjustments/Labs and Tests Ordered: Current medicines are reviewed at length with the patient today.  Concerns regarding medicines are outlined above.   Tests Ordered: No orders of the defined types were placed in this encounter.   Medication Changes: No orders of the defined types were placed in this encounter.   Disposition:  Follow up with APP in 1 month.  Shereka Lafortune C. Duke Salviaandolph, MD, [facility] in 6 months.  Signed, Chilton Siiffany Parshall, MD  08/05/2018 9:57 AM    [facility]

## 2018-08-06 MED FILL — LOSARTAN POTASSIUM 50 MG TA: 50 | 30 days supply | Qty: 30 | Fill #0

## 2018-08-10 ENCOUNTER — Telehealth: Payer: Self-pay | Admitting: Cardiovascular Disease

## 2018-08-10 NOTE — Telephone Encounter (Signed)
New Message     Pt says  The nurse from the wellness center sent her the wrong medication in the mail.  She said they sent her Losartan and she doesn't take that  She said they were suppose to send Levothyroxine.    Please call

## 2018-08-10 NOTE — Telephone Encounter (Signed)
Called patient, tried to explain to patient the reasoning behind the change in medication, and that the Levothyroxine comes from PCP that was submitted to pharmacy on 04/17, I explained to patient that it should be at pharmacy. I asked if she had asked the pharmacy and she stated that she was unable to get through to them- and then said that she was told they didn't even have the prescription. Patient became very upset on the phone and began screaming that she did not have any of her medicine- I tried to explain to patient that the medication was sent through her PCP, and patient kept saying the Dr.Canon was going to send the medication in for her. I advised I would send a message to nurse to make her aware- patient then became upset again and hung the phone up.

## 2018-08-11 NOTE — Telephone Encounter (Signed)
Left message to call back  

## 2018-08-13 ENCOUNTER — Telehealth: Payer: Self-pay | Admitting: Licensed Clinical Social Worker

## 2018-08-13 NOTE — Telephone Encounter (Signed)
CSW contacted patient to follow up on weekly food delivery package. Patient informed of delivery time and no face to face contact during delivery. Message left as no answer.  CSW continues to follow for supportive needs. Jackie Kelissa Merlin, LCSW, CCSW-MCS [phone] 

## 2018-08-16 ENCOUNTER — Other Ambulatory Visit: Payer: Self-pay | Admitting: Cardiovascular Disease

## 2018-08-16 MED ORDER — FUROSEMIDE 40 MG PO TABS: 40.0000 mg | ORAL_TABLET | Freq: Two times a day (BID) | ORAL | 5 refills | Status: DC

## 2018-08-16 NOTE — Telephone Encounter (Signed)
° ° ° °*  STAT* If patient is at the pharmacy, call can be transferred to refill team.   1. Which medications need to be refilled? (please list name of each medication and dose if known) amLODipine (NORVASC) 10 MG tablet, furosemide (LASIX) 40 MG tablet, levothyroxine (SYNTHROID) 25 MCG tablet   2. Which pharmacy/location (including street and city if local pharmacy) is medication to be sent to? [facility] [place], [place] - [place] [address]. Do they need a 30 day or 90 day supply? 90

## 2018-08-20 ENCOUNTER — Telehealth: Payer: Self-pay | Admitting: Licensed Clinical Social Worker

## 2018-08-20 NOTE — Telephone Encounter (Signed)
CSW contacted patient to follow up on weekly food delivery package. Patient informed of delivery time and no face to face contact during delivery. Message left as no answer.  CSW continues to follow for supportive needs. Jackie Miya Luviano, LCSW, CCSW-MCS [phone] 

## 2018-08-23 ENCOUNTER — Telehealth: Payer: Self-pay | Admitting: Licensed Clinical Social Worker

## 2018-08-23 NOTE — Telephone Encounter (Signed)
Patient called to request assistance with medications. Patient states she recently had car trouble and has used up her monthly income to fix her car. Patient has an outstanding balance at the pharmacy and unable to get her medications. CSW will assist with patient care fund and pharmacy will mail out medications to patient. Patient grateful for the assistance.CSW available as needed. Lasandra Beech, LCSW, CCSW-MCS [phone]

## 2018-08-24 ENCOUNTER — Telehealth: Payer: Self-pay | Admitting: Licensed Clinical Social Worker

## 2018-08-24 ENCOUNTER — Other Ambulatory Visit: Payer: Self-pay | Admitting: Cardiovascular Disease

## 2018-08-24 MED ORDER — AMLODIPINE BESYLATE 10 MG PO TABS: 10.0000 mg | ORAL_TABLET | Freq: Every day | ORAL | 2 refills | Status: DC

## 2018-08-24 MED ORDER — FUROSEMIDE 40 MG PO TABS: 40.0000 mg | ORAL_TABLET | Freq: Two times a day (BID) | ORAL | 3 refills | Status: DC

## 2018-08-24 MED FILL — FUROSEMIDE 40 MG TAB: 40 | 30 days supply | Qty: 60 | Fill #0

## 2018-08-24 NOTE — Telephone Encounter (Signed)
New Message     *STAT* If patient is at the pharmacy, call can be transferred to refill team.   1. Which medications need to be refilled? (please list name of each medication and dose if known) Amlodipine, Furosemide, Levothyroxine  2. Which pharmacy/location (including street and city if local pharmacy) is medication to be sent to? [facility]   3. Do they need a 30 day or 90 day supply? 90 days    Pt is out of medication

## 2018-08-24 NOTE — Telephone Encounter (Signed)
CSW contacted patient to inform the one of her medications was covered and will be sent via mail from the [facility]. Patient states she had others but assumes since she didn't have the monies to pay they were re shelved. Patient plans to call Pharmacy and refill meds and will reach out to CSW if further assistance is needed. Lasandra Beech, LCSW, CCSW-MCS [phone]

## 2018-08-25 ENCOUNTER — Telehealth: Payer: Self-pay | Admitting: Licensed Clinical Social Worker

## 2018-08-25 MED FILL — AMLODIPINE BESYLATE 10 MG T: 10 | 30 days supply | Qty: 30 | Fill #0

## 2018-08-25 MED FILL — FUROSEMIDE 40 MG TAB: 40 | 30 days supply | Qty: 60 | Fill #0

## 2018-08-25 NOTE — Telephone Encounter (Signed)
CSW attempted to follow up with patient regarding medication assistance. Message left for return call. Lasandra Beech, LCSW, CCSW-MCS [phone]

## 2018-08-26 ENCOUNTER — Telehealth: Payer: Self-pay | Admitting: Licensed Clinical Social Worker

## 2018-08-26 NOTE — Telephone Encounter (Signed)
CSW contacted patient to follow up on weekly food delivery package. Patient informed of change in day due to Memorial Day to Wednesday delivery time and no face to face contact during delivery. Patient also informed that her medications will be mailed to her and pharmacy cost covered by patient care fund.  Message left as no answer.  CSW continues to follow for supportive needs. Lasandra Beech, LCSW, CCSW-MCS [phone]

## 2018-09-02 ENCOUNTER — Telehealth: Payer: Self-pay | Admitting: Cardiology

## 2018-09-03 ENCOUNTER — Telehealth: Payer: Self-pay | Admitting: Licensed Clinical Social Worker

## 2018-09-03 NOTE — Telephone Encounter (Signed)
Called x3 for pre reg °

## 2018-09-03 NOTE — Telephone Encounter (Signed)
CSW contacted patient to follow up on weekly food delivery package. Patient informed of delivery time and no face to face contact during delivery. CSW shared transition option for food delivery as the Covid 19 Food relief program will be ending on September 17, 2018. Message left as no answer.  CSW continues to follow for supportive needs. Jackie Glorious Flicker, LCSW, CCSW-MCS [phone] 

## 2018-09-06 ENCOUNTER — Telehealth: Payer: Self-pay | Admitting: Cardiology

## 2018-09-06 ENCOUNTER — Telehealth: Payer: Self-pay

## 2018-09-06 NOTE — Telephone Encounter (Signed)
Pt was not available for visit

## 2018-09-09 ENCOUNTER — Telehealth: Payer: Self-pay | Admitting: Licensed Clinical Social Worker

## 2018-09-09 ENCOUNTER — Ambulatory Visit: Payer: Self-pay | Admitting: Family Medicine

## 2018-09-09 NOTE — Telephone Encounter (Signed)
CSW contacted patient to follow up on weekly food delivery package. Patient informed of delivery time and no face to face contact during delivery. CSW shared transition option for food delivery as the Covid 19 Food relief program will be ending on September 13, 2018. Message left as no answer.  CSW continues to follow for supportive needs. Jackie Crystin Lechtenberg, LCSW, CCSW-MCS [phone] 

## 2018-09-16 ENCOUNTER — Telehealth: Payer: Self-pay | Admitting: Licensed Clinical Social Worker

## 2018-09-16 NOTE — Telephone Encounter (Signed)
CSW contacted patient to follow up on Moms meals application. CSW informed patient that application submitted and delivery date of meals. Patient informed this program is temporary although hopeful to support patient through the summer. Patient unavailable and message left. CSW will continue to monitor and be available as needed.  Raquel Sarna, [place], [place]

## 2018-09-27 MED FILL — AMLODIPINE BESYLATE 10 MG T: 10 | 30 days supply | Qty: 30 | Fill #1

## 2018-09-27 MED FILL — LOSARTAN POTASSIUM 50 MG TA: 50 | 30 days supply | Qty: 30 | Fill #1

## 2018-09-27 MED FILL — LEVOTHYROXINE 25 MCG TABLET: 25 | 30 days supply | Qty: 30 | Fill #0

## 2018-09-28 ENCOUNTER — Telehealth: Payer: Self-pay | Admitting: Licensed Clinical Social Worker

## 2018-09-28 NOTE — Telephone Encounter (Signed)
CSW attempted to contact patient to confirm receipt of Moms meals package. Message left for return call. Jackie Jania Steinke, LCSW, CCSW-MCS [phone]  

## 2018-10-05 NOTE — Telephone Encounter (Signed)
Open n error °

## 2018-10-11 MED FILL — CARVEDILOL 25 MG TABLET: 25 | 30 days supply | Qty: 60 | Fill #1

## 2018-11-02 ENCOUNTER — Telehealth: Payer: Self-pay | Admitting: Licensed Clinical Social Worker

## 2018-11-02 ENCOUNTER — Telehealth (HOSPITAL_COMMUNITY): Payer: Self-pay | Admitting: Licensed Clinical Social Worker

## 2018-11-02 NOTE — Telephone Encounter (Signed)
CSW attempted to contact patient to follow up on potential need post Moms Meals program. The last delivery will be this week. Message left for return call. Jackie Turner Kunzman, LCSW, CCSW-MCS [phone] 

## 2018-11-02 NOTE — Telephone Encounter (Signed)
CSW contacted patient to inform the last delivery of the Moms meals Summer Covid relief program will be July 31st. Patient verbalizes understanding and grateful for the support during this health crisis. Patient provided with the [employer]oms Meals number to contact if interested in continuing with a private pay option. Patient denies any other concerns at this time. CSW continues to be available if needed.  Jackie Keiondra [place], LCSW, CCSW-MCS [phone]  

## 2018-11-08 MED FILL — POTASSIUM CL ER 20 MEQ TAB: 20 | 30 days supply | Qty: 30 | Fill #1

## 2018-11-08 MED FILL — LOSARTAN POTASSIUM 50 MG TA: 50 | 30 days supply | Qty: 30 | Fill #2

## 2018-11-08 MED FILL — AMLODIPINE BESYLATE 10 MG T: 10 | 30 days supply | Qty: 30 | Fill #2

## 2018-11-08 MED FILL — LEVOTHYROXINE 25 MCG TABLET: 25 | 30 days supply | Qty: 30 | Fill #1

## 2018-11-08 MED FILL — CARVEDILOL 25 MG TABLET: 25 | 30 days supply | Qty: 60 | Fill #2

## 2018-12-06 MED FILL — LOSARTAN POTASSIUM 50 MG TA: 50 | 30 days supply | Qty: 30 | Fill #3

## 2018-12-06 MED FILL — LEVOTHYROXINE 25 MCG TABLET: 25 | 30 days supply | Qty: 30 | Fill #2

## 2018-12-06 MED FILL — AMLODIPINE BESYLATE 10 MG T: 10 | 30 days supply | Qty: 30 | Fill #3

## 2019-01-12 MED FILL — CARVEDILOL 25 MG TABLET: 25 | 30 days supply | Qty: 60 | Fill #3

## 2019-01-12 MED FILL — LOSARTAN POTASSIUM 50 MG TA: 50 | 30 days supply | Qty: 30 | Fill #4

## 2019-01-12 MED FILL — LEVOTHYROXINE 25 MCG TABLET: 25 | 30 days supply | Qty: 30 | Fill #3

## 2019-01-12 MED FILL — AMLODIPINE BESYLATE 10 MG T: 10 | 30 days supply | Qty: 30 | Fill #4

## 2019-02-08 MED FILL — LEVOTHYROXINE 25 MCG TABLET: 25 | 30 days supply | Qty: 30 | Fill #4

## 2019-02-08 MED FILL — LOSARTAN POTASSIUM 50 MG TA: 50 | 30 days supply | Qty: 30 | Fill #5

## 2019-02-08 MED FILL — AMLODIPINE BESYLATE 10 MG T: 10 | 30 days supply | Qty: 30 | Fill #5

## 2019-02-08 MED FILL — CARVEDILOL 25 MG TABLET: 25 | 30 days supply | Qty: 60 | Fill #4

## 2019-02-28 ENCOUNTER — Telehealth: Payer: Self-pay | Admitting: Cardiovascular Disease

## 2019-02-28 NOTE — Telephone Encounter (Signed)
Attempted to contact patient back-  It stated on number provided that an error had occurred. Will try again later.

## 2019-02-28 NOTE — Telephone Encounter (Signed)
Pt c/o BP issue: STAT if pt c/o blurred vision, one-sided weakness or slurred speech  1. What are your last 5 BP readings? 130-150 Patient does not have one exact reading   2. Are you having any other symptoms (ex. Dizziness, headache, blurred vision, passed out)?  Headache & Blurred vision   3. What is your BP issue? Patient is waking up stating her BP is running high while sleeping. 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 Patient is losing her hair, but does not know what medication is causing it.

## 2019-03-01 ENCOUNTER — Other Ambulatory Visit: Payer: Self-pay | Admitting: Cardiovascular Disease

## 2019-03-09 NOTE — Telephone Encounter (Addendum)
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

## 2019-03-09 NOTE — Telephone Encounter (Signed)
Mailbox full unable to leave message Will try later ./cy 

## 2019-03-10 NOTE — Telephone Encounter (Signed)
Unable to reach patient or leave message. VM full 

## 2019-03-10 NOTE — Telephone Encounter (Signed)
Increase losartan to 100mg .  BMP in a week.  Track BP and follow up with PHarmD in 2-4 weeks.

## 2019-03-15 NOTE — Telephone Encounter (Signed)
Mailbox full

## 2019-03-16 MED FILL — POTASSIUM CL ER 20 MEQ TAB: 20 | 30 days supply | Qty: 30 | Fill #0

## 2019-03-16 MED FILL — FUROSEMIDE 40 MG TAB: 40 | 30 days supply | Qty: 60 | Fill #1

## 2019-03-16 MED FILL — LEVOTHYROXINE 25 MCG TABLET: 25 | 30 days supply | Qty: 30 | Fill #5

## 2019-03-16 MED FILL — LOSARTAN POTASSIUM 50 MG TA: 50 | 30 days supply | Qty: 30 | Fill #6

## 2019-03-16 MED FILL — AMLODIPINE BESYLATE 10 MG T: 10 | 30 days supply | Qty: 30 | Fill #6

## 2019-03-16 MED FILL — CARVEDILOL 25 MG TABLET: 25 | 30 days supply | Qty: 60 | Fill #5

## 2019-03-25 NOTE — Telephone Encounter (Signed)
Mailbox full

## 2019-04-11 ENCOUNTER — Telehealth: Payer: Self-pay | Admitting: Licensed Clinical Social Worker

## 2019-04-11 MED FILL — LEVOTHYROXINE 25 MCG TABLET: 25 | 30 days supply | Qty: 30 | Fill #0

## 2019-04-11 MED FILL — AMLODIPINE BESYLATE 10 MG T: 10 | 30 days supply | Qty: 30 | Fill #7

## 2019-04-11 MED FILL — LOSARTAN POTASSIUM 50 MG TA: 50 | 30 days supply | Qty: 30 | Fill #7

## 2019-04-11 NOTE — Telephone Encounter (Signed)
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 Patient reports she has been getting her meds form the [facility] pharmacy on the discount program. She is unable to get the meds transferred to another pharmacy and maintain the same discount prices at the [facility]. CSW explained the [facility] program and that it is offered through their pharmacy. CSW explained the GoodRx card and cost savings on medications at [employer] as well. Patient verbalizes understanding and hopeful to try and get back to [place] before meds run out if not she will explore options provided by CSW. Patient grateful for the support and information. CSW available as needed. Lasandra Beech, LCSW, CCSW-MCS [phone]

## 2019-04-26 ENCOUNTER — Telehealth: Payer: Self-pay | Admitting: Licensed Clinical Social Worker

## 2019-04-26 NOTE — Telephone Encounter (Signed)
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 Patient states she went to a [employer] in [place] but they will only take her if she quarantines for a few days. She has an opportunity to make some money tomorrow to pay her car loan and was in tears stating that if she leaves tomorrow morning for the job she will then have no housing and if she stays she will have no car. Patient states she owes $64 by tomorrow for the car loan. CSW assisted with assistance through the Patient Care Fund. Patient grateful for the support and relied to have some housing and will have a job opportunity next week. CSW available as needed. Lasandra Beech, LCSW, CCSW-MCS [phone]

## 2019-05-12 NOTE — Telephone Encounter (Signed)
Unable to reach patient.

## 2019-05-13 MED FILL — CARVEDILOL 25 MG TABLET: 25 | 60 days supply | Qty: 120 | Fill #1

## 2019-05-13 MED FILL — LOSARTAN POTASSIUM 50 MG TA: 50 | 60 days supply | Qty: 60 | Fill #8

## 2019-05-13 MED FILL — FUROSEMIDE 40 MG TAB: 40 | 60 days supply | Qty: 120 | Fill #2

## 2019-05-13 MED FILL — AMLODIPINE BESYLATE 10 MG T: 10 | 60 days supply | Qty: 60 | Fill #1

## 2019-05-25 ENCOUNTER — Telehealth: Payer: Self-pay | Admitting: Cardiovascular Disease

## 2019-05-25 MED FILL — LEVOTHYROXINE 25 MCG TABLET: 25 | 7 days supply | Qty: 7 | Fill #1

## 2019-05-25 NOTE — Telephone Encounter (Signed)
Pt c/o medication issue:  1. Name of Medication: levothyroxine (SYNTHROID) 25 MCG tablet  2. How are you currently taking this medication (dosage and times per day)? As directed  3. Are you having a reaction (difficulty breathing--STAT)? no  4. What is your medication issue? Pharmacy needs provider approval to change manufacturers of this medicine.   The patient is out of medication, and the pharmacy would like an answer today so they can fill the rx for the pt. Dr. Duke Salvia has written the medication in the past, but [name], NP was the most recent provider to issue an rx

## 2019-05-25 NOTE — Telephone Encounter (Signed)
The pharmacy has already called pertaining to this. The PCP was the last to fill this medication. They will be calling the PCP.

## 2019-05-30 ENCOUNTER — Telehealth: Payer: Self-pay | Admitting: Family Medicine

## 2019-05-30 NOTE — Telephone Encounter (Signed)
Hello [name]! This is Shayla! I am sorry to message you on here, but I have been trying to reach you. I have no idea where you are located now, and every phone number I call, no1 can seem to tell me lol. Patient Andrea Barrett, [birth year] takes levothyroxine. The manufacturer that we were giving her is no longer available so we have to change it. We have to get the doctors approval any time it needs to be changed. Tiffany Duke Salvia told me she couldn't do it and that it had to be you. 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 I will let her know that she needs to schedule an appointment (because I think she is overdue anyway's) and also an appointment to get lab work. Thank you so much for your time. If you need to call me for anything my phone number is [phone].

## 2019-05-30 NOTE — Telephone Encounter (Signed)
Andrea Barrett,   I'm no longer at primary care. I am part-time at urgent care. Marcy Siren is the provider over [place] now. I am routing message to Dr. Earlene Plater and Laurena Bering , CMA to address. Thanks so much for handling.  Kindest Regards,  [name], FNP

## 2019-07-15 ENCOUNTER — Ambulatory Visit: Payer: Self-pay | Attending: Internal Medicine

## 2019-07-18 ENCOUNTER — Telehealth: Payer: Self-pay | Admitting: Cardiovascular Disease

## 2019-07-18 NOTE — Telephone Encounter (Signed)
We are recommending the COVID-19 vaccine to all of our patients. Cardiac medications (including blood thinners) should not deter anyone from being vaccinated and there is no need to hold any of those medications prior to vaccine administration.     Currently, there is a hotline to call (active 04/15/19) to schedule vaccination appointments as no walk-ins will be accepted.   Number: [phone].    If an appointment is not available please go to [email or web address] to sign up for notification when additional vaccine appointments are available.   If you have further questions or concerns about the vaccine process, please visit [email or web address] or contact your primary care physician.   

## 2019-07-21 ENCOUNTER — Ambulatory Visit: Payer: Self-pay

## 2019-07-26 ENCOUNTER — Other Ambulatory Visit: Payer: Self-pay | Admitting: Cardiovascular Disease

## 2019-07-26 MED FILL — LOSARTAN POTASSIUM 50 MG TA: 50 | 60 days supply | Qty: 60 | Fill #9

## 2019-07-26 MED FILL — AMLODIPINE BESYLATE 10 MG T: 10 | 30 days supply | Qty: 30 | Fill #8

## 2019-07-28 ENCOUNTER — Other Ambulatory Visit: Payer: Self-pay | Admitting: Cardiovascular Disease

## 2019-07-28 ENCOUNTER — Ambulatory Visit: Payer: Self-pay | Admitting: Cardiovascular Disease

## 2019-07-28 NOTE — Telephone Encounter (Signed)
Levothyroxine refill refused, per previous note defer to PCP

## 2019-08-31 ENCOUNTER — Other Ambulatory Visit: Payer: Self-pay | Admitting: Cardiovascular Disease

## 2019-09-01 MED FILL — CARVEDILOL 25 MG TABLET: 25 | 30 days supply | Qty: 60 | Fill #0

## 2019-09-01 MED FILL — LOSARTAN POTASSIUM 50 MG TA: 50 | 30 days supply | Qty: 30 | Fill #0

## 2019-09-01 MED FILL — AMLODIPINE BESYLATE 10 MG T: 10 | 30 days supply | Qty: 30 | Fill #0

## 2019-09-07 ENCOUNTER — Telehealth: Payer: Self-pay | Admitting: *Deleted

## 2019-09-07 ENCOUNTER — Telehealth: Payer: Self-pay | Admitting: Cardiology

## 2019-09-07 NOTE — Telephone Encounter (Signed)
2nd attempt to contact patient by  Cell phone   [phone] phone   Listed- [phone]  [phone] , home phone disconnected  And  [phone]

## 2019-10-06 ENCOUNTER — Telehealth: Payer: Self-pay | Admitting: Cardiovascular Disease

## 2019-10-06 MED ORDER — CARVEDILOL 25 MG PO TABS: 25.0000 mg | ORAL_TABLET | Freq: Two times a day (BID) | ORAL | 0 refills | Status: DC

## 2019-10-06 MED ORDER — LOSARTAN POTASSIUM 50 MG PO TABS: 50.0000 mg | ORAL_TABLET | Freq: Every day | ORAL | 0 refills | Status: DC

## 2019-10-06 MED ORDER — AMLODIPINE BESYLATE 10 MG PO TABS: 10.0000 mg | ORAL_TABLET | Freq: Every day | ORAL | 0 refills | Status: DC

## 2019-10-06 MED FILL — AMLODIPINE BESYLATE 10 MG T: 10 | 30 days supply | Qty: 30 | Fill #0

## 2019-10-06 MED FILL — LOSARTAN POTASSIUM 50 MG TA: 50 | 30 days supply | Qty: 30 | Fill #0

## 2019-10-06 MED FILL — CARVEDILOL 25 MG TABLET: 25 | 30 days supply | Qty: 60 | Fill #0

## 2019-10-06 NOTE — Telephone Encounter (Signed)
New message   Patient needs a new prescription for carvedilol (COREG) 25 MG tablet   losartan (COZAAR) 50 MG tablet  amLODipine (NORVASC) 10 MG tablet please send the prescription to [facility] [place], [place] - [place] [address] [employer]

## 2019-10-12 NOTE — Progress Notes (Deleted)
Cardiology Clinic Note   Patient Name: Andrea Barrett Date of Encounter: 10/12/2019  Primary Care Provider:  Patient, No Pcp Per Primary Cardiologist:  Chilton Si, MD  Patient Profile    Andrea Barrett 44 year old female presents to the clinic today for follow-up evaluation of her chronic diastolic CHF, essential hypertension, and hypothyroidism  Past Medical History    Past Medical History:  Diagnosis Date  . Essential hypertension 05/31/2018  . Hypertension   . Hypothyroidism   . Noncompliance 05/31/2018  . Thyroid disease    Past Surgical History:  Procedure Laterality Date  . CESAREAN SECTION     x 2  . TUBAL LIGATION      Allergies  No Known Allergies  History of Present Illness    Andrea Barrett has a PMH of chronic diastolic CHF, hypercholesterolemia, hypothyroidism, and hypertension.  She was initially seen by Dr. Duke Salvia 2/20 for evaluation of her diastolic heart failure.  She was first diagnosed with hypertension in 2019.  Her blood pressure was 210/110 at that time.  She indicated that it had never been well controlled.  She was hospitalized with diastolic heart failure for the first time 8/19 and then 10/19.  Her first admission was at [facility] and then that has come hospital.  She left [facility] AMA with no medications because she did not like how they were treating her.  At that time she indicated she was short of breath, had lower extremity edema, and was orthopneic.  She also reported having headache and blurred vision.  She was diuresed and started on antihypertensive medications.  Her echocardiogram 10/19 showed an LVEF of 60-65%, moderate LVH, and grade 1 diastolic dysfunction.  Her renal artery Dopplers were negative for RA S.  She also has a history of medication noncompliance.  She was last seen by Dr. Duke Salvia on 08/05/2018.  During that time she was again noted to have poorly controlled hypertension.  She had been out of her  medications for quite some time and no adjustments have been made.  She was encouraged to work on taking her medication, diet, exercise, and low her blood pressures.  Unfortunately she lost her job 06/2018 due to COVID-19.  Since that time she was noted to have increased stress and had been eating unhealthy meals.  She did get her blood pressure medications but was unable to fill her levothyroxine or potassium due to cost.  She was also trying to care for her mother at that time.  She reported more frequent headaches and had been taking Goody powders.  At the time of visit she indicated that she had lost her car and that her depression was poorly controlled.  She indicated that she had been off of her depression medications for 10-11 years however, she had been experiencing more depressive symptoms at that time.  She denied chest pain, shortness of breath.  She had intermittent lower extremity swelling that would increase when she was not taking her Lasix.  She was drinking 7-8 waters daily.  She denied orthopnea and PND.  She presents to the clinic today for follow-up evaluation and states***  *** denies chest pain, shortness of breath, lower extremity edema, fatigue, palpitations, melena, hematuria, hemoptysis, diaphoresis, weakness, presyncope, syncope, orthopnea, and PND.    Home Medications    Prior to Admission medications   Medication Sig Start Date End Date Taking? Authorizing Provider  amLODipine (NORVASC) 10 MG tablet Take 1 tablet (10 mg total) by  mouth daily. KEEP OV. 10/06/19   Chilton Si, MD  carvedilol (COREG) 25 MG tablet Take 1 tablet (25 mg total) by mouth 2 (two) times daily with a meal. KEEP OV. 10/06/19   Chilton Si, MD  ferrous sulfate (FERROUSUL) 325 (65 FE) MG tablet Take 1 tablet (325 mg total) by mouth 3 (three) times daily with meals for 30 days. ADDITIONAL REFILLS FROM PRIMARY CARE 05/31/18 08/05/18  Chilton Si, MD  furosemide (LASIX) 40 MG tablet Take 1  tablet (40 mg total) by mouth 2 (two) times daily. Take 1 potassium tablet with lasix dose. 08/24/18   Chilton Si, MD  levothyroxine (SYNTHROID) 25 MCG tablet TAKE EVERY MORNING 30 MINUTES BEFORE BREAKFAST ADDITIONAL REFILLS FROM PRIMARY CARE 07/23/18   Bing Neighbors, FNP  losartan (COZAAR) 50 MG tablet Take 1 tablet (50 mg total) by mouth daily. KEEP OV. 10/06/19 11/05/19  Chilton Si, MD  potassium chloride SA (KLOR-CON) 20 MEQ tablet Take 1 tablet (20 mEq total) by mouth daily. PT OVERDUE FOR OV PLEASE CALL FOR APPT 03/01/19   Chilton Si, MD    Family History    Family History  Problem Relation Age of Onset  . Other Mother        HTN  . Hypertension Mother   . Diabetes Father   . Hypertension Father   . Stroke Maternal Grandmother   . Hypertension Maternal Grandmother    She indicated that the status of her mother is unknown. She indicated that the status of her father is unknown. She indicated that the status of her maternal grandmother is unknown.  Social History    Social History   Socioeconomic History  . Marital status: Single    Spouse name: Not on file  . Number of children: Not on file  . Years of education: Not on file  . Highest education level: Not on file  Occupational History  . Not on file  Tobacco Use  . Smoking status: Never Smoker  . Smokeless tobacco: Never Used  Vaping Use  . Vaping Use: Never used  Substance and Sexual Activity  . Alcohol use: No  . Drug use: No  . Sexual activity: Yes    Birth control/protection: Surgical  Other Topics Concern  . Not on file  Social History Narrative  . Not on file   Social Determinants of Health   Financial Resource Strain:   . Difficulty of Paying Living Expenses:   Food Insecurity:   . Worried About Programme researcher, broadcasting/film/video in the Last Year:   . Barista in the Last Year:   Transportation Needs:   . Freight forwarder (Medical):   Marland Kitchen Lack of Transportation (Non-Medical):     Physical Activity:   . Days of Exercise per Week:   . Minutes of Exercise per Session:   Stress:   . Feeling of Stress :   Social Connections:   . Frequency of Communication with Friends and Family:   . Frequency of Social Gatherings with Friends and Family:   . Attends Religious Services:   . Active Member of Clubs or Organizations:   . Attends Banker Meetings:   Marland Kitchen Marital Status:   Intimate Partner Violence:   . Fear of Current or Ex-Partner:   . Emotionally Abused:   Marland Kitchen Physically Abused:   . Sexually Abused:      Review of Systems    General:  No chills, fever, night sweats or weight changes.  Cardiovascular:  No chest pain, dyspnea on exertion, edema, orthopnea, palpitations, paroxysmal nocturnal dyspnea. Dermatological: No rash, lesions/masses Respiratory: No cough, dyspnea Urologic: No hematuria, dysuria Abdominal:   No nausea, vomiting, diarrhea, bright red blood per rectum, melena, or hematemesis Neurologic:  No visual changes, wkns, changes in mental status. All other systems reviewed and are otherwise negative except as noted above.  Physical Exam    VS:  There were no vitals taken for this visit. , BMI There is no height or weight on file to calculate BMI. GEN: Well nourished, well developed, in no acute distress. HEENT: normal. Neck: Supple, no JVD, carotid bruits, or masses. Cardiac: RRR, no murmurs, rubs, or gallops. No clubbing, cyanosis, edema.  Radials/DP/PT 2+ and equal bilaterally.  Respiratory:  Respirations regular and unlabored, clear to auscultation bilaterally. GI: Soft, nontender, nondistended, BS + x 4. MS: no deformity or atrophy. Skin: warm and dry, no rash. Neuro:  Strength and sensation are intact. Psych: Normal affect.  Accessory Clinical Findings    ECG personally reviewed by me today- *** - No acute changes  EKG 07/30/2018 Normal sinus rhythm 62 bpm LVH  Echocardiogram 07/29/2017 Study Conclusions   - Left ventricle:  The cavity size was normal. Wall thickness was  increased increased in a pattern of mild to moderate LVH.  Systolic function was normal. Wall motion was normal; there were  no regional wall motion abnormalities.  - Left atrium: The atrium was mildly to moderately dilated.   Impressions:   - 1. Left ventricle systolic function is preserved visually  estimated at approximately 55 to 60%. Mild to moderate concentric  left ventricular hypertrophy.  2. Mild to moderate dilatation of the left atrium.   Assessment & Plan   1.  Essential hypertension-BP today***.  History of medication noncompliance.  Well-controlled at home. Continue amlodipine, carvedilol, irbesartan, furosemide Heart healthy low-sodium diet-salty 6 given Increase physical activity as tolerated Keep blood pressure log  Chronic diastolic heart failure-echocardiogram showed LVEF 55-60%, moderate to mild concentric LVH, and mild to moderate dilated left atrium.  This is due to her hypertension Continue carvedilol, amlodipine, irbesartan, furosemide Heart healthy low-sodium diet-salty 6 given Increase physical activity as tolerated Order BMP  Hypothyroidism-previously unable to afford levothyroxine.  Now receiving medications through [facility] wellness Followed by PCP  Disposition: Follow-up with Dr. Duke Salvia or myself in 3 month.   Thomasene Ripple. Ryn Peine NP-C    10/12/2019, 11:07 PM [facility] [address] Office [phone] Fax [phone]

## 2019-10-13 ENCOUNTER — Ambulatory Visit: Payer: Self-pay | Admitting: General Practice

## 2019-11-10 ENCOUNTER — Encounter: Payer: Self-pay | Admitting: Cardiology

## 2019-11-10 ENCOUNTER — Other Ambulatory Visit: Payer: Self-pay

## 2019-11-10 ENCOUNTER — Other Ambulatory Visit: Payer: Self-pay | Admitting: Cardiology

## 2019-11-10 ENCOUNTER — Ambulatory Visit (INDEPENDENT_AMBULATORY_CARE_PROVIDER_SITE_OTHER): Payer: Self-pay | Admitting: Cardiology

## 2019-11-10 VITALS — BP 134/86 | HR 62 | Ht 67.0 in | Wt 302.8 lb

## 2019-11-10 DIAGNOSIS — G4733 Obstructive sleep apnea (adult) (pediatric): Secondary | ICD-10-CM

## 2019-11-10 DIAGNOSIS — I10 Essential (primary) hypertension: Secondary | ICD-10-CM

## 2019-11-10 DIAGNOSIS — Z91199 Patient's noncompliance with other medical treatment and regimen due to unspecified reason: Secondary | ICD-10-CM

## 2019-11-10 DIAGNOSIS — E039 Hypothyroidism, unspecified: Secondary | ICD-10-CM

## 2019-11-10 DIAGNOSIS — G4730 Sleep apnea, unspecified: Secondary | ICD-10-CM | POA: Insufficient documentation

## 2019-11-10 DIAGNOSIS — I5032 Chronic diastolic (congestive) heart failure: Secondary | ICD-10-CM

## 2019-11-10 DIAGNOSIS — Z9119 Patient's noncompliance with other medical treatment and regimen: Secondary | ICD-10-CM

## 2019-11-10 LAB — BASIC METABOLIC PANEL
BUN/Creatinine Ratio: 15 (ref 9–23)
BUN: 9 mg/dL (ref 6–24)
CO2: 24 mmol/L (ref 20–29)
Calcium: 9.1 mg/dL (ref 8.7–10.2)
Chloride: 101 mmol/L (ref 96–106)
Creatinine, Ser: 0.6 mg/dL (ref 0.57–1.00)
GFR calc Af Amer: 129 mL/min/{1.73_m2} (ref >59)
GFR calc non Af Amer: 112 mL/min/{1.73_m2} (ref >59)
Glucose: 91 mg/dL (ref 65–99)
Potassium: 4 mmol/L (ref 3.5–5.2)
Sodium: 134 mmol/L (ref 134–144)

## 2019-11-10 LAB — T4, FREE: Free T4: 1 ng/dL (ref 0.82–1.77)

## 2019-11-10 LAB — TSH: TSH: 4.29 u[IU]/mL (ref 0.450–4.500)

## 2019-11-10 MED ORDER — FERROUS SULFATE 325 (65 FE) MG PO TABS: 325.0000 mg | ORAL_TABLET | Freq: Three times a day (TID) | ORAL | 3 refills | Status: AC

## 2019-11-10 MED ORDER — AMLODIPINE BESYLATE 10 MG PO TABS: 10.0000 mg | ORAL_TABLET | Freq: Every day | ORAL | 3 refills | Status: DC

## 2019-11-10 MED ORDER — POTASSIUM CHLORIDE CRYS ER 20 MEQ PO TBCR: 20.0000 meq | EXTENDED_RELEASE_TABLET | Freq: Every day | ORAL | 3 refills | Status: AC

## 2019-11-10 MED ORDER — LEVOTHYROXINE SODIUM 25 MCG PO TABS: 25.0000 ug | ORAL_TABLET | Freq: Every day | ORAL | 0 refills | Status: AC

## 2019-11-10 MED ORDER — CARVEDILOL 25 MG PO TABS: 25.0000 mg | ORAL_TABLET | Freq: Two times a day (BID) | ORAL | 3 refills | Status: DC

## 2019-11-10 MED ORDER — FUROSEMIDE 40 MG PO TABS: 40.0000 mg | ORAL_TABLET | Freq: Two times a day (BID) | ORAL | 3 refills | Status: AC

## 2019-11-10 MED ORDER — LEVOTHYROXINE SODIUM 25 MCG PO TABS: 25.0000 ug | ORAL_TABLET | Freq: Every day | ORAL | Status: DC

## 2019-11-10 MED ORDER — LOSARTAN POTASSIUM 50 MG PO TABS: 50.0000 mg | ORAL_TABLET | Freq: Every day | ORAL | 3 refills | Status: AC

## 2019-11-10 MED FILL — POTASSIUM CL ER 20 MEQ TABL: 20 | 30 days supply | Qty: 30 | Fill #0

## 2019-11-10 MED FILL — ?CARVEDILOL 25 MG TABLET: 25 | 30 days supply | Qty: 60 | Fill #0

## 2019-11-10 MED FILL — LOSARTAN POTASSIUM 50 MG TA: 50 | 30 days supply | Qty: 30 | Fill #0

## 2019-11-10 MED FILL — AMLODIPINE BESYLATE 10 MG T: 10 | 90 days supply | Qty: 90 | Fill #0

## 2019-11-10 MED FILL — LEVOTHYROXINE SODIUM 25 MCG: 25 | 30 days supply | Qty: 30 | Fill #0

## 2019-11-10 MED FILL — FERROUS SULFATE 325 MG TAB: 325 (65 FE) | 30 days supply | Qty: 90 | Fill #0

## 2019-11-10 MED FILL — FUROSEMIDE 40 MG TAB: 40 | 30 days supply | Qty: 60 | Fill #0

## 2019-11-10 NOTE — Assessment & Plan Note (Signed)
BMI 47 

## 2019-11-10 NOTE — Assessment & Plan Note (Signed)
Suspected sleep apnea- unable to afford sleep study

## 2019-11-10 NOTE — Patient Instructions (Signed)
Medication Instructions:  Your physician recommends that you continue on your current medications as directed. Please refer to the Current Medication list given to you today.  *If you need a refill on your cardiac medications before your next appointment, please call your pharmacy*   Lab Work: Your physician recommends that you return for lab work today: BMET, TSH, Free T4  If you have labs (blood work) drawn today and your tests are completely normal, you will receive your results only by: Marland Kitchen MyChart Message (if you have MyChart) OR . A paper copy in the mail If you have any lab test that is abnormal or we need to change your treatment, we will call you to review the results.  Follow-Up: At [facility], you and your health needs are our priority.  As part of our continuing mission to provide you with exceptional heart care, we have created designated Provider Care Teams.  These Care Teams include your primary Cardiologist (physician) and Advanced Practice Providers (APPs -  Physician Assistants and Nurse Practitioners) who all work together to provide you with the care you need, when you need it.  We recommend signing up for the patient portal called "MyChart".  Sign up information is provided on this After Visit Summary.  MyChart is used to connect with patients for Virtual Visits (Telemedicine).  Patients are able to view lab/test results, encounter notes, upcoming appointments, etc.  Non-urgent messages can be sent to your provider as well.   To learn more about what you can do with MyChart, go to [email or web address].    Your next appointment:   2 month(s)  The format for your next appointment:   In Person  Provider:   Chilton Si, MD   Other Instructions I have sent a message to our Care Guide, Amy Lee. She will be in contact with you about finding a Primary Care Provider.

## 2019-11-10 NOTE — Assessment & Plan Note (Signed)
Secondary to financial problems

## 2019-11-10 NOTE — Assessment & Plan Note (Signed)
B/P is currently under good control- medications just resumed 10/06/2019

## 2019-11-10 NOTE — Assessment & Plan Note (Addendum)
Non compliant with Synthroid for > one year. I will resume Synthroid 25 mcg for one month.   Check TSH,free T4.  She needs a PCP- I will ask Amy with care Guide to assist [place].

## 2019-11-10 NOTE — Progress Notes (Signed)
Cardiology Office Note:    Date:  11/10/2019   ID:  Andrea Barrett, DOB 12/24/1975, MRN [id number]  PCP:  Patient, No Pcp Per  Cardiologist:  Chilton Si, MD  Electrophysiologist:  None   Referring MD: No ref. provider found   Chief Complaint  Patient presents with  . Edema    bilateal ankles/knees/hands  . Headache    History of Present Illness:    Andrea Barrett is a pleasant, morbidly obese 44 y.o. female with a hx of hypertension, diastolic heart failure, hypothyroidism, and past depression.  In 2019 she was seen for diastolic heart failure.  Echocardiogram in October 2019 showed an ejection fraction of 60 to 65% with moderate LVH and grade 1 diastolic dysfunction.  Renal artery Dopplers were negative for renal artery stenosis then.  The patient's had issues with compliance since.  She intermittently runs out of her medications.  She has been employed off and on but the main hurdle to getting her medications is financial.  She was last seen in April 2020 by Dr. Duke Salvia.  She had lost her job secondary to [employer].  Patient is in office today to get her medications filled.  They were filled a few weeks ago but only until she had this appointment.  The patient's primary care provider apparently moved to urgent care.  She has not had medications for some time, she says she has been out of her thyroid medication for more than a year.  Her blood pressure medications were all refilled July 1 and her blood pressures actually been under good control.  The patient does complain of a headache, fatigue, and lower extremity edema which she attributes to her thyroid problem.  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  She denies orthopnea or chest pain.  Past Medical History:  Diagnosis Date  . Essential hypertension 05/31/2018  . Hypertension   . Hypothyroidism   . Noncompliance 05/31/2018  . Thyroid disease     Past Surgical History:   Procedure Laterality Date  . CESAREAN SECTION     x 2  . TUBAL LIGATION      Current Medications: Current Meds  Medication Sig  . amLODipine (NORVASC) 10 MG tablet Take 1 tablet (10 mg total) by mouth daily.  . carvedilol (COREG) 25 MG tablet Take 1 tablet (25 mg total) by mouth 2 (two) times daily with a meal.  . furosemide (LASIX) 40 MG tablet Take 1 tablet (40 mg total) by mouth 2 (two) times daily. Take 1 potassium tablet with lasix dose.  . potassium chloride SA (KLOR-CON) 20 MEQ tablet Take 1 tablet (20 mEq total) by mouth daily.  . [DISCONTINUED] amLODipine (NORVASC) 10 MG tablet Take 1 tablet (10 mg total) by mouth daily. KEEP OV.  . [DISCONTINUED] carvedilol (COREG) 25 MG tablet Take 1 tablet (25 mg total) by mouth 2 (two) times daily with a meal. KEEP OV.  . [DISCONTINUED] furosemide (LASIX) 40 MG tablet Take 1 tablet (40 mg total) by mouth 2 (two) times daily. Take 1 potassium tablet with lasix dose.  . [DISCONTINUED] levothyroxine (SYNTHROID) 25 MCG tablet TAKE EVERY MORNING 30 MINUTES BEFORE BREAKFAST ADDITIONAL REFILLS FROM PRIMARY CARE  . [DISCONTINUED] potassium chloride SA (KLOR-CON) 20 MEQ tablet Take 1 tablet (20 mEq total) by mouth daily. PT OVERDUE FOR OV PLEASE CALL FOR APPT     Allergies:   Patient has no known allergies.   Social  History   Socioeconomic History  . Marital status: Single    Spouse name: Not on file  . Number of children: Not on file  . Years of education: Not on file  . Highest education level: Not on file  Occupational History  . Not on file  Tobacco Use  . Smoking status: Never Smoker  . Smokeless tobacco: Never Used  Vaping Use  . Vaping Use: Never used  Substance and Sexual Activity  . Alcohol use: No  . Drug use: No  . Sexual activity: Yes    Birth control/protection: Surgical  Other Topics Concern  . Not on file  Social History Narrative  . Not on file   Social Determinants of Health   Financial Resource Strain:   .  Difficulty of Paying Living Expenses:   Food Insecurity:   . Worried About Programme researcher, broadcasting/film/video in the Last Year:   . Barista in the Last Year:   Transportation Needs:   . Freight forwarder (Medical):   Marland Kitchen Lack of Transportation (Non-Medical):   Physical Activity:   . Days of Exercise per Week:   . Minutes of Exercise per Session:   Stress:   . Feeling of Stress :   Social Connections:   . Frequency of Communication with Friends and Family:   . Frequency of Social Gatherings with Friends and Family:   . Attends Religious Services:   . Active Member of Clubs or Organizations:   . Attends Banker Meetings:   Marland Kitchen Marital Status:      Family History: The patient's family history includes Diabetes in her father; Hypertension in her father, maternal grandmother, and mother; Other in her mother; Stroke in her maternal grandmother.  ROS:   Please see the history of present illness.     All other systems reviewed and are negative.  EKGs/Labs/Other Studies Reviewed:    The following studies were reviewed today: Echo 07/30/2019- Study Conclusions   - Left ventricle: The cavity size was normal. Wall thickness was  increased increased in a pattern of mild to moderate LVH.  Systolic function was normal. Wall motion was normal; there were  no regional wall motion abnormalities.  - Left atrium: The atrium was mildly to moderately dilated.   EKG:  EKG is ordered today.  The ekg ordered today demonstrates NSR, R 62  Recent Labs: No results found for requested labs within last 8760 hours.  Recent Lipid Panel No results found for: CHOL, TRIG, HDL, CHOLHDL, VLDL, LDLCALC, LDLDIRECT  Physical Exam:    VS:  BP 134/86 (BP Location: Left Arm, Patient Position: Sitting, Cuff Size: Large)   Pulse 62   Ht 5\' 7"  (1.702 m)   Wt (!) 302 lb 12.8 oz (137.3 kg)   BMI 47.43 kg/m     Wt Readings from Last 3 Encounters:  11/10/19 (!) 302 lb 12.8 oz (137.3 kg)  05/31/18  283 lb 12.8 oz (128.7 kg)  02/19/18 273 lb 9.6 oz (124.1 kg)     GEN: Morbidly obese AA female,well developed in no acute distress HEENT: Normal NECK: No JVD; No carotid bruits, thick neck, no tenderness CARDIAC: RRR, no murmurs, rubs, gallops RESPIRATORY:  Clear to auscultation without rales, wheezing or rhonchi  ABDOMEN: Soft, non-tender, non-distended MUSCULOSKELETAL:  trace edema; No deformity  SKIN: Warm and dry NEUROLOGIC:  Alert and oriented x 3 PSYCHIATRIC:  Normal affect   ASSESSMENT:    Essential hypertension B/P is currently under good  control- medications just resumed 10/06/2019  Chronic diastolic CHF (congestive heart failure) (HCC) Compensated- I think her edema is secondary to hypothyroidism  Hypothyroidism Non compliant with Synthroid for > one year. I will resume Synthroid 25 mcg for one month.   Check TSH,free T4.  She needs a PCP- I will ask Amy with care Guide to assist us.   Noncompliance Secondary to financial problems  Morbid obesity (HCC) BMI 47  Sleep apnea Suspected sleep apnea- unable to afford sleep study  PLAN:    Refill HTN ,medications- check BMP. I did refill her prior dose of Synthroid.  Check TSH, freeT4.  She needs a PCP, will ask Amy to see if we can help with this.    Medication Adjustments/Labs and Tests Ordered: Current medicines are reviewed at length with the patient today.  Concerns regarding medicines are outlined above.  Orders Placed This Encounter  Procedures  . Basic metabolic panel  . TSH  . T4, free  . EKG 12-Lead   Meds ordered this encounter  Medications  . amLODipine (NORVASC) 10 MG tablet    Sig: Take 1 tablet (10 mg total) by mouth daily.    Dispense:  90 tablet    Refill:  3    KEEP OV.  . carvedilol (COREG) 25 MG tablet    Sig: Take 1 tablet (25 mg total) by mouth 2 (two) times daily with a meal.    Dispense:  180 tablet    Refill:  3    KEEP OV.  . ferrous sulfate (FERROUSUL) 325 (65 FE) MG tablet     Sig: Take 1 tablet (325 mg total) by mouth 3 (three) times daily with meals. ADDITIONAL REFILLS FROM PRIMARY CARE    Dispense:  90 tablet    Refill:  3    ADDITIONAL REFILLS FROM PRIMARY CARE  . furosemide (LASIX) 40 MG tablet    Sig: Take 1 tablet (40 mg total) by mouth 2 (two) times daily. Take 1 potassium tablet with lasix dose.    Dispense:  180 tablet    Refill:  3  . losartan (COZAAR) 50 MG tablet    Sig: Take 1 tablet (50 mg total) by mouth daily.    Dispense:  90 tablet    Refill:  3    KEEP OV.  Marland Kitchen. potassium chloride SA (KLOR-CON) 20 MEQ tablet    Sig: Take 1 tablet (20 mEq total) by mouth daily.    Dispense:  90 tablet    Refill:  3  . DISCONTD: levothyroxine (SYNTHROID) tablet 25 mcg  . levothyroxine (SYNTHROID) 25 MCG tablet    Sig: Take 1 tablet (25 mcg total) by mouth daily before breakfast. Please follow-up with a primary care provider.    Dispense:  30 tablet    Refill:  0    Patient Instructions  Medication Instructions:  Your physician recommends that you continue on your current medications as directed. Please refer to the Current Medication list given to you today.  *If you need a refill on your cardiac medications before your next appointment, please call your pharmacy*   Lab Work: Your physician recommends that you return for lab work today: BMET, TSH, Free T4  If you have labs (blood work) drawn today and your tests are completely normal, you will receive your results only by: Marland Kitchen. MyChart Message (if you have MyChart) OR . A paper copy in the mail If you have any lab test that is abnormal or we need to  change your treatment, we will call you to review the results.  Follow-Up: At [facility], you and your health needs are our priority.  As part of our continuing mission to provide you with exceptional heart care, we have created designated Provider Care Teams.  These Care Teams include your primary Cardiologist (physician) and Advanced Practice Providers  (APPs -  Physician Assistants and Nurse Practitioners) who all work together to provide you with the care you need, when you need it.  We recommend signing up for the patient portal called "MyChart".  Sign up information is provided on this After Visit Summary.  MyChart is used to connect with patients for Virtual Visits (Telemedicine).  Patients are able to view lab/test results, encounter notes, upcoming appointments, etc.  Non-urgent messages can be sent to your provider as well.   To learn more about what you can do with MyChart, go to [email or web address].    Your next appointment:   2 month(s)  The format for your next appointment:   In Person  Provider:   Chilton Si, MD   Other Instructions I have sent a message to our Care Guide, Amy Lee. She will be in contact with you about finding a Primary Care Provider.     Jolene Provost, PA-C  11/10/2019 8:49 AM    [facility]

## 2019-11-10 NOTE — Assessment & Plan Note (Signed)
Compensated- I think her edema is secondary to hypothyroidism

## 2019-11-11 ENCOUNTER — Telehealth: Payer: Self-pay | Admitting: Licensed Clinical Social Worker

## 2019-11-11 NOTE — Telephone Encounter (Signed)
CSW referred to assist patient with PCP and insurance options. Patient is well known to CSW from past needs. Patient reports she is on unemployment due to a work related accident. She has multiple unpaid bills with [facility] and unable to afford payment plan set up with financial navigation. CSW will explore PCP with the [facility] and after she establishes care she will be able to meet with financial counseling for further advice. Patient agreeable to plan and will await return call from CSW once appointment obtained. Lasandra Beech, LCSW, CCSW-MCS [phone]

## 2019-11-15 ENCOUNTER — Telehealth: Payer: Self-pay | Admitting: Licensed Clinical Social Worker

## 2019-11-15 NOTE — Telephone Encounter (Signed)
CSW obtained a PCP appointment for patient on 12/01/2019 @ 1050 at [facility]. CSW attempted to reach patient with no success. Unable to leave message. CSW will attempt again. Lasandra Beech, LCSW, CCSW-MCS [phone]

## 2019-11-16 ENCOUNTER — Telehealth: Payer: Self-pay | Admitting: Licensed Clinical Social Worker

## 2019-11-16 NOTE — Telephone Encounter (Signed)
CSW received return call from patient regarding PCP appointment. CSW provided patient with appointment at [facility] on 12/01/2019 @ 10:50am. Patient appreciative of support and assistance. Lasandra Beech, LCSW, CCSW-MCS [phone] '

## 2019-11-16 NOTE — Telephone Encounter (Signed)
Patient called to follow up on the status of an appointment to another provider. I told the patient I was going to put her on hold to reach out to the Child psychotherapist but the call was disconnected

## 2019-11-23 ENCOUNTER — Emergency Department (HOSPITAL_BASED_OUTPATIENT_CLINIC_OR_DEPARTMENT_OTHER): Payer: Self-pay

## 2019-11-23 ENCOUNTER — Emergency Department (HOSPITAL_BASED_OUTPATIENT_CLINIC_OR_DEPARTMENT_OTHER)
Admission: EM | Admit: 2019-11-23 | Discharge: 2019-11-23 | Disposition: A | Discharge status: Home / Self Care | Payer: Self-pay | Attending: Emergency Medicine | Admitting: Emergency Medicine

## 2019-11-23 ENCOUNTER — Encounter (HOSPITAL_BASED_OUTPATIENT_CLINIC_OR_DEPARTMENT_OTHER): Payer: Self-pay | Admitting: Emergency Medicine

## 2019-11-23 ENCOUNTER — Other Ambulatory Visit: Payer: Self-pay

## 2019-11-23 DIAGNOSIS — E039 Hypothyroidism, unspecified: Secondary | ICD-10-CM | POA: Insufficient documentation

## 2019-11-23 DIAGNOSIS — Y9389 Activity, other specified: Secondary | ICD-10-CM | POA: Insufficient documentation

## 2019-11-23 DIAGNOSIS — Z79899 Other long term (current) drug therapy: Secondary | ICD-10-CM | POA: Insufficient documentation

## 2019-11-23 DIAGNOSIS — I5032 Chronic diastolic (congestive) heart failure: Secondary | ICD-10-CM | POA: Insufficient documentation

## 2019-11-23 DIAGNOSIS — M25559 Pain in unspecified hip: Secondary | ICD-10-CM

## 2019-11-23 DIAGNOSIS — I110 Hypertensive heart disease with heart failure: Secondary | ICD-10-CM | POA: Insufficient documentation

## 2019-11-23 DIAGNOSIS — M792 Neuralgia and neuritis, unspecified: Secondary | ICD-10-CM | POA: Insufficient documentation

## 2019-11-23 DIAGNOSIS — Y990 Civilian activity done for income or pay: Secondary | ICD-10-CM | POA: Insufficient documentation

## 2019-11-23 DIAGNOSIS — Y9289 Other specified places as the place of occurrence of the external cause: Secondary | ICD-10-CM | POA: Insufficient documentation

## 2019-11-23 MED ORDER — GABAPENTIN 300 MG PO CAPS: 300.0000 mg | ORAL_CAPSULE | Freq: Once | ORAL | Status: AC

## 2019-11-23 MED ORDER — DEXAMETHASONE 4 MG PO TABS: 4.0000 mg | ORAL_TABLET | Freq: Two times a day (BID) | ORAL | 0 refills | Status: AC

## 2019-11-23 MED ORDER — GABAPENTIN 300 MG PO CAPS
300.0000 mg | ORAL_CAPSULE | Freq: Three times a day (TID) | ORAL | 0 refills | Status: AC
Start: 2019-11-23 — End: ?

## 2019-11-23 MED ORDER — DEXAMETHASONE 6 MG PO TABS
12.0000 mg | ORAL_TABLET | Freq: Once | ORAL | Status: AC
  Administered 2019-11-23: 12 mg via ORAL
  Filled 2019-11-23: qty 2

## 2019-11-23 MED ORDER — GABAPENTIN 300 MG PO CAPS
ORAL_CAPSULE | ORAL | Status: AC
  Administered 2019-11-23: 300 mg
  Filled 2019-11-23: qty 1

## 2019-11-23 MED ORDER — IBUPROFEN 400 MG PO TABS
600.0000 mg | ORAL_TABLET | Freq: Once | ORAL | Status: AC
  Administered 2019-11-23: 600 mg via ORAL
  Filled 2019-11-23: qty 1

## 2019-11-23 MED ORDER — GABAPENTIN 600 MG PO TABS
300.0000 mg | ORAL_TABLET | Freq: Once | ORAL | Status: DC
  Filled 2019-11-23: qty 0.5

## 2019-11-23 NOTE — Discharge Instructions (Signed)
In addition to prescribed medications, I also want you to take 600 mg of ibuprofen every 6 hours as needed.

## 2019-11-23 NOTE — ED Triage Notes (Signed)
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

## 2019-11-30 NOTE — ED Provider Notes (Signed)
[facility] EMERGENCY DEPARTMENT Provider Note   CSN: [id number] Arrival date & time: 11/23/19  7106     History Chief Complaint  Patient presents with  . Fall    Andrea Barrett is a 44 y.o. female.  HPI   44 year old female with right buttock/hip pain.  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  She was getting out of the [place] when she lost her balance and fell back onto her right buttock/hip.  She has had persistent pain since.  Worse with movement.  Simply interfering with ambulation.  No numbness or tingling.  Past Medical History:  Diagnosis Date  . Essential hypertension 05/31/2018  . Hypertension   . Hypothyroidism   . Noncompliance 05/31/2018  . Thyroid disease     Patient Active Problem List   Diagnosis Date Noted  . Morbid obesity (HCC) 11/10/2019  . Sleep apnea 11/10/2019  . Essential hypertension 05/31/2018  . Noncompliance 05/31/2018  . Chronic diastolic CHF (congestive heart failure) (HCC) 07/30/2017  . Iron deficiency anemia 07/30/2017  . Hypothyroidism 07/30/2017    Past Surgical History:  Procedure Laterality Date  . CESAREAN SECTION     x 2  . TUBAL LIGATION       OB History   No obstetric history on file.     Family History  Problem Relation Age of Onset  . Other Mother        HTN  . Hypertension Mother   . Diabetes Father   . Hypertension Father   . Stroke Maternal Grandmother   . Hypertension Maternal Grandmother     Social History   Tobacco Use  . Smoking status: Never Smoker  . Smokeless tobacco: Never Used  Vaping Use  . Vaping Use: Never used  Substance Use Topics  . Alcohol use: No  . Drug use: No    Home Medications Prior to Admission medications   Medication Sig Start Date End Date Taking? Authorizing Provider  amLODipine (NORVASC) 10 MG tablet Take 1 tablet (10 mg total) by mouth daily. 11/10/19   Abelino Derrick, PA-C  carvedilol (COREG) 25 MG tablet Take 1 tablet (25 mg total) by mouth 2 (two) times daily with a  meal. 11/10/19   Kilroy, Eda Paschal, PA-C  dexamethasone (DECADRON) 4 MG tablet Take 1 tablet (4 mg total) by mouth 2 (two) times daily. 11/23/19   Raeford Razor, MD  ferrous sulfate (FERROUSUL) 325 (65 FE) MG tablet Take 1 tablet (325 mg total) by mouth 3 (three) times daily with meals. ADDITIONAL REFILLS FROM PRIMARY CARE 11/10/19 12/10/19  Abelino Derrick, PA-C  furosemide (LASIX) 40 MG tablet Take 1 tablet (40 mg total) by mouth 2 (two) times daily. Take 1 potassium tablet with lasix dose. 11/10/19   Abelino Derrick, PA-C  gabapentin (NEURONTIN) 300 MG capsule Take 1 capsule (300 mg total) by mouth 3 (three) times daily. 11/23/19   Raeford Razor, MD  levothyroxine (SYNTHROID) 25 MCG tablet Take 1 tablet (25 mcg total) by mouth daily before breakfast. Please follow-up with a primary care provider. 11/10/19   Abelino Derrick, PA-C  losartan (COZAAR) 50 MG tablet Take 1 tablet (50 mg total) by mouth daily. 11/10/19 12/10/19  Abelino Derrick, PA-C  potassium chloride SA (KLOR-CON) 20 MEQ tablet Take 1 tablet (20 mEq total) by mouth daily. 11/10/19   Abelino Derrick, PA-C    Allergies    Patient has no known allergies.  Review of Systems   Review of Systems All  systems reviewed and negative, other than as noted in HPI.  Physical Exam Updated Vital Signs BP (!) 160/90   Pulse 70   Temp 99.1 F (37.3 C) (Oral)   Resp 16   Ht 5\' 7"  (1.702 m)   Wt 136.1 kg   LMP 10/24/2019 (Approximate)   SpO2 99%   BMI 46.99 kg/m   Physical Exam Vitals and nursing note reviewed.  Constitutional:      General: She is not in acute distress.    Appearance: She is well-developed.  HENT:     Head: Normocephalic and atraumatic.  Eyes:     General:        Right eye: No discharge.        Left eye: No discharge.     Conjunctiva/sclera: Conjunctivae normal.  Cardiovascular:     Rate and Rhythm: Normal rate and regular rhythm.     Heart sounds: Normal heart sounds. No murmur heard.  No friction rub. No gallop.   Pulmonary:      Effort: Pulmonary effort is normal. No respiratory distress.     Breath sounds: Normal breath sounds.  Abdominal:     General: There is no distension.     Palpations: Abdomen is soft.     Tenderness: There is no abdominal tenderness.  Musculoskeletal:        General: Tenderness present.     Cervical back: Neck supple.     Comments: Tenderness along the right buttock and lateral hip.  Range of the hip though really does not seem to exacerbate that much.  No midline spinal tenderness.  Neurovascular intact.  Skin:    General: Skin is warm and dry.  Neurological:     Mental Status: She is alert.  Psychiatric:        Behavior: Behavior normal.        Thought Content: Thought content normal.     ED Results / Procedures / Treatments   Labs (all labs ordered are listed, but only abnormal results are displayed) Labs Reviewed - No data to display  EKG None  Radiology No results found.   DG HIP UNILAT WITH PELVIS 2-3 VIEWS RIGHT  Result Date: 11/23/2019 CLINICAL DATA:  Right hip pain after fall EXAM: DG HIP (WITH OR WITHOUT PELVIS) 2-3V RIGHT COMPARISON:  None. FINDINGS: There is no evidence of hip fracture or dislocation. There is no evidence of arthropathy or other focal bone abnormality. IMPRESSION: Negative. Electronically Signed   By: 11/25/2019 D.O.   On: 11/23/2019 08:03   Procedures Procedures (including critical care time)  Medications Ordered in ED Medications  dexamethasone (DECADRON) tablet 12 mg (12 mg Oral Given 11/23/19 0902)  ibuprofen (ADVIL) tablet 600 mg (600 mg Oral Given 11/23/19 0900)  gabapentin (NEURONTIN) capsule 300 mg (300 mg Oral Given 11/23/19 11/25/19)    ED Course  I have reviewed the triage vital signs and the nursing notes.  Pertinent labs & imaging results that were available during my care of the patient were reviewed by me and considered in my medical decision making (see chart for details).    MDM Rules/Calculators/A&P                          44 year old female with pain in her right buttock/hip.  Seems more neuropathic than anything based on her description.  She is neurovascularly intact.  Plan symptomatic treatment.  Activity as tolerated.  Return precautions discussed.  Outpatient follow-up otherwise  for persistent symptoms.  Final Clinical Impression(s) / ED Diagnoses Final diagnoses:  Neuropathic pain    Rx / DC Orders ED Discharge Orders         Ordered    dexamethasone (DECADRON) 4 MG tablet  2 times daily        11/23/19 0846    gabapentin (NEURONTIN) 300 MG capsule  3 times daily        11/23/19 0846           Raeford Razor, MD 11/30/19 1003

## 2019-12-01 ENCOUNTER — Ambulatory Visit: Payer: Self-pay | Admitting: Internal Medicine

## 2020-01-03 MED FILL — LOSARTAN POTASSIUM 50 MG TA: 50 | 30 days supply | Qty: 30 | Fill #1

## 2020-01-13 ENCOUNTER — Ambulatory Visit: Payer: Self-pay | Admitting: Cardiovascular Disease

## 2020-02-02 ENCOUNTER — Ambulatory Visit: Payer: Self-pay | Admitting: Cardiovascular Disease

## 2020-02-14 ENCOUNTER — Telehealth: Payer: Self-pay | Admitting: Licensed Clinical Social Worker

## 2020-02-14 NOTE — Progress Notes (Signed)
CSW received return call from [facility] and they will follow up with patient. CSW informed patient of expected follow up call regarding housing options. CSW available as needed. Lasandra Beech, LCSW, CCSW-MCS [phone]
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
## 2020-02-16 ENCOUNTER — Telehealth: Payer: Self-pay | Admitting: Licensed Clinical Social Worker

## 2020-02-16 NOTE — Telephone Encounter (Signed)
CSW contacted patient to follow up on housing referrals provided earlier this week. Patient states she has not heard from [facility]. CSW provided contact information to patient for resource and encouraged return call. Patient verbalizes understanding of follow up needed and has CSW contact info if needed. Lasandra Beech, LCSW, CCSW-MCS [phone]

## 2020-02-17 ENCOUNTER — Telehealth (HOSPITAL_COMMUNITY): Payer: Self-pay | Admitting: Licensed Clinical Social Worker

## 2020-02-17 NOTE — Telephone Encounter (Signed)
CSW received call from [place] at [employer] [facility]. CSW informed that patient has not received follow up call yet and requested assistance for housing. Raoul Pitch will follow up and assess patient for housing programs. CSW available as needed. Lasandra Beech, LCSW, CCSW-MCS [phone]

## 2020-03-05 MED FILL — ?CARVEDILOL 25 MG TABLET: 25 | 30 days supply | Qty: 60 | Fill #1

## 2020-03-05 MED FILL — ?LOSARTAN POTASSIUM 50 MG T: 50 | 30 days supply | Qty: 30 | Fill #2

## 2020-03-28 ENCOUNTER — Telehealth: Payer: Self-pay | Admitting: Licensed Clinical Social Worker

## 2020-03-28 NOTE — Telephone Encounter (Signed)
CSW received message from patient requesting number for contact person at [facility]. CSW returned call and left requested information on voicemail. CSW available if needed. Lasandra Beech, LCSW, CCSW-MCS [phone]

## 2020-04-18 MED FILL — AMLODIPINE BESYLATE 10 MG T: 10 | 90 days supply | Qty: 90 | Fill #1

## 2020-04-26 MED FILL — AMLODIPINE BESYLATE 10 MG T: 10 | 30 days supply | Qty: 30 | Fill #1

## 2020-04-30 ENCOUNTER — Ambulatory Visit: Payer: Self-pay | Admitting: Cardiovascular Disease

## 2020-05-15 MED FILL — LOSARTAN POTASSIUM 50 MG TA: 50 | 30 days supply | Qty: 30 | Fill #3

## 2020-07-10 ENCOUNTER — Encounter (HOSPITAL_BASED_OUTPATIENT_CLINIC_OR_DEPARTMENT_OTHER): Payer: Self-pay | Admitting: Emergency Medicine

## 2020-07-25 ENCOUNTER — Other Ambulatory Visit: Payer: Self-pay

## 2020-08-06 NOTE — Progress Notes (Incomplete)
Cardiology Office Visit   Evaluation Performed:  Follow-up visit  Date:  08/06/2020   ID:  Andrea Barrett, DOB 14-May-1975, MRN [id number]  PCP:  Patient, No Pcp Per (Inactive)  Cardiologist:  Chilton Si, MD  Electrophysiologist:  None   Chief Complaint:  Hypertension   History of Present Illness:    Andrea Barrett is a 44 y.o. female with chronic diastolic heart failure, hypercholesterolemia, hypothyroidism, and hypertension  here for follow-up.  She was initially seen 05/31/2018 for the evaluation of diastolic heart failure.  She was first diagnosed with hypertension in 2019.  Her BP was 220/110 at the time.  It has never been well-controlled.  She was hospitalized with diastolic heart failure for the first time 11/2017 and then 01/2018.  The first was at [facility] and then [facility].  She [facility] with no medications because she didn't like how they were treating her.  At the time she has shortness of breath, LE edema, and orthopnea.  She also reported headache and blurry vision.  She was diuresed and started on antihypertensives.  Echo 01/2018 revealed LVEF 60-65% with moderate LVH and grade 1 diastolic dysfunction.  Renal artery dopplers at that time were negative for RAS.  She now reports that she has been out of her medications for the last 2-3 weeks.  She reports that she was denies a refill on her medicaton by her PCP's office because she was coming to see cardiology, though there is no documentation of her calling.  She last saw [name], FNP, 02/2018 and her BP was 136/96 on her medications.    Unfortunately she lost her job 06/2018 due to COVID-19. She struggled with affording medications and caring for family members. She followed with our social workers for housing assistance.    Today, she  She denies any chest pain, shortness of breath, or palpitations. No pre-syncope, syncope, or lightheadedness/dizziness to note. Also has no lower  extremity edema, orthopnea or PND.     Past Medical History:  Diagnosis Date   Essential hypertension 05/31/2018   Hypertension    Hypothyroidism    Noncompliance 05/31/2018   Thyroid disease    Past Surgical History:  Procedure Laterality Date   CESAREAN SECTION     x 2   TUBAL LIGATION       No outpatient medications have been marked as taking for the 08/07/20 encounter (Appointment) with Chilton Si, MD.     Allergies:   Patient has no known allergies.   Social History   Tobacco Use   Smoking status: Never Smoker   Smokeless tobacco: Never Used  Vaping Use   Vaping Use: Never used  Substance Use Topics   Alcohol use: No   Drug use: No     Family Hx: The patient's family history includes Diabetes in her father; Hypertension in her father, maternal grandmother, and mother; Other in her mother; Stroke in her maternal grandmother.  ROS:   Please see the history of present illness.    (+) All other systems reviewed and are negative.   Prior CV studies:   The following studies were reviewed today:  Echo 07/29/2017: Study Conclusions  - Left ventricle: The cavity size was normal. Wall thickness was   increased increased in a pattern of mild to moderate LVH.   Systolic function was normal. Wall motion was normal; there were   no regional wall motion abnormalities. - Left atrium: The atrium was mildly to  moderately dilated.  Impressions:  - 1. Left ventricle systolic function is preserved visually   estimated at approximately 55 to 60%. Mild to moderate concentric   left ventricular hypertrophy.   2. Mild to moderate dilatation of the left atrium.  Labs/Other Tests and Data Reviewed:    EKG:   05/31/2018: Sinus rhythm. Rate 62 bpm. LVH. 08/07/2020:  Recent Labs: 11/10/2019: BUN 9; Creatinine, Ser 0.60; Potassium 4.0; Sodium 134; TSH 4.290   Recent Lipid Panel No results found for: CHOL, TRIG, HDL, CHOLHDL, LDLCALC, LDLDIRECT  Wt  Readings from Last 3 Encounters:  11/23/19 300 lb (136.1 kg)  11/10/19 (!) 302 lb 12.8 oz (137.3 kg)  05/31/18 283 lb 12.8 oz (128.7 kg)     Objective:    VS:  There were no vitals taken for this visit. , BMI There is no height or weight on file to calculate BMI. GENERAL:  Well appearing HEENT: Pupils equal round and reactive, fundi not visualized, oral mucosa unremarkable NECK:  No jugular venous distention, waveform within normal limits, carotid upstroke brisk and symmetric, no bruits, no thyromegaly LYMPHATICS:  No cervical adenopathy LUNGS:  Clear to auscultation bilaterally HEART:  RRR.  PMI not displaced or sustained,S1 and S2 within normal limits, no S3, no S4, no clicks, no rubs, *** murmurs ABD:  Flat, positive bowel sounds normal in frequency in pitch, no bruits, no rebound, no guarding, no midline pulsatile mass, no hepatomegaly, no splenomegaly EXT:  2 plus pulses throughout, no edema, no cyanosis no clubbing SKIN:  No rashes no nodules NEURO:  Cranial nerves II through XII grossly intact, motor grossly intact throughout PSYCH:  Cognitively intact, oriented to person place and time    ASSESSMENT & PLAN:   No problem-specific Assessment & Plan notes found for this encounter.   # Hypertension:  Ms. Ovens has been taking her medication.  However she does not have a blood pressure cuff so it is unclear what her blood pressure is actually running.  Therefore will not make any changes at this time.  Continue amlodipine, carvedilol, irbesartan, and Lasix.  # Chronic diastolic heart failure: This is 2/2 hypertension.  BP control as above.    Volume status is improved with Lasix.  # Hypothyroidism: Patient has been unable to afford her levothyroxine prescription.  She will be picking this up from [facility].  Ultimately this will need to be followed with her PCP.  # Social stress: Ms. Linhart lost her job due to COVID-19.  She is struggling financially.  We will  contact her social worker to see if we can assist with meals and getting her blood pressure machine.   COVID-19 Education: The signs and symptoms of COVID-19 were discussed with the patient and how to seek care for testing (follow up with PCP or arrange E-visit).  The importance of social distancing was discussed today.  Time:   Today, I have spent 23 minutes with the patient with telehealth technology discussing the above problems.     Medication Adjustments/Labs and Tests Ordered: Current medicines are reviewed at length with the patient today.  Concerns regarding medicines are outlined above.   Tests Ordered: No orders of the defined types were placed in this encounter.   Medication Changes: No orders of the defined types were placed in this encounter.   Disposition:  Follow up with APP in 1 month.  Tiffany C. Duke Salvia, MD, [facility] in 6 months.  I,Mathew Stumpf,acting as a Neurosurgeon for [employer], MD.,have documented all  relevant documentation on the behalf of Chilton Si, MD,as directed by  Chilton Si, MD while in the presence of Chilton Si, MD.  ***  Signed, Carlena Bjornstad  08/06/2020 1:23 PM    [facility]

## 2020-08-07 ENCOUNTER — Ambulatory Visit: Payer: Self-pay | Admitting: Cardiovascular Disease

## 2020-08-21 ENCOUNTER — Other Ambulatory Visit: Payer: Self-pay

## 2020-08-21 MED FILL — Losartan Potassium Tab 50 MG: ORAL | 30 days supply | Qty: 30 | Fill #0 | Status: AC

## 2020-08-21 MED FILL — Amlodipine Besylate Tab 10 MG (Base Equivalent): ORAL | 30 days supply | Qty: 30 | Fill #0 | Status: AC

## 2020-08-23 ENCOUNTER — Other Ambulatory Visit: Payer: Self-pay

## 2020-08-31 ENCOUNTER — Telehealth: Payer: Self-pay | Admitting: Cardiovascular Disease

## 2020-08-31 NOTE — Telephone Encounter (Signed)
Patient states she tested positive for COVID on 08/27/20. She states she has tested negative twice since her initial test, but she would like to know if Dr. Duke Salvia recommends any medications post-COVID. Please advise.

## 2020-08-31 NOTE — Telephone Encounter (Signed)
Per chart review, patient has no PCP  Will send to MD to see if she has recommendations on any meds, vitamins, etc that would be recommended to take

## 2020-09-04 NOTE — Telephone Encounter (Signed)
Spoke with patient and she is concerned about COVID with her cardiac conditions since she keeps testing positive.   Advised patient Dr Duke Salvia does not treat COVID and she would need to reach out to Urgent Care or  [email or web address]  For virtual visit and possible treatment options. Patient took down link and stated she would reach out for virtual visit

## 2020-09-04 NOTE — Telephone Encounter (Signed)
  Patient is calling to follow up on message from 08/31/20. She thinks she made need an antibiotic. Patient does not have a PCP.

## 2020-09-04 NOTE — Telephone Encounter (Signed)
Will route to primary nurse, she will have MD review for recommendations.

## 2020-10-02 ENCOUNTER — Other Ambulatory Visit: Payer: Self-pay

## 2020-10-02 MED FILL — Amlodipine Besylate Tab 10 MG (Base Equivalent): ORAL | 90 days supply | Qty: 90 | Fill #1 | Status: CN

## 2020-10-02 MED FILL — Losartan Potassium Tab 50 MG: ORAL | 90 days supply | Qty: 90 | Fill #1 | Status: CN

## 2020-10-10 ENCOUNTER — Other Ambulatory Visit: Payer: Self-pay

## 2020-10-11 ENCOUNTER — Other Ambulatory Visit: Payer: Self-pay

## 2020-10-11 MED FILL — Losartan Potassium Tab 50 MG: ORAL | 30 days supply | Qty: 30 | Fill #1 | Status: AC

## 2020-10-11 MED FILL — Amlodipine Besylate Tab 10 MG (Base Equivalent): ORAL | 30 days supply | Qty: 30 | Fill #1 | Status: AC

## 2020-10-15 ENCOUNTER — Other Ambulatory Visit: Payer: Self-pay

## 2020-11-13 ENCOUNTER — Other Ambulatory Visit: Payer: Self-pay

## 2020-11-13 ENCOUNTER — Other Ambulatory Visit: Payer: Self-pay | Admitting: Cardiology

## 2020-11-13 MED ORDER — LOSARTAN POTASSIUM 50 MG PO TABS
50.0000 mg | ORAL_TABLET | Freq: Every day | ORAL | 0 refills | Status: DC
  Filled 2020-11-13 – 2020-11-23 (×2): qty 30, 30d supply, fill #0
  Filled 2020-12-26: qty 30, 30d supply, fill #1

## 2020-11-13 MED ORDER — AMLODIPINE BESYLATE 10 MG PO TABS
10.0000 mg | ORAL_TABLET | Freq: Every day | ORAL | 0 refills | Status: DC
  Filled 2020-11-13 – 2020-11-23 (×2): qty 30, 30d supply, fill #0
  Filled 2020-12-26: qty 30, 30d supply, fill #1

## 2020-11-13 MED ORDER — CARVEDILOL 25 MG PO TABS
25.0000 mg | ORAL_TABLET | Freq: Two times a day (BID) | ORAL | 0 refills | Status: AC
  Filled 2020-11-13 – 2020-12-26 (×2): qty 60, 30d supply, fill #0

## 2020-11-20 ENCOUNTER — Other Ambulatory Visit: Payer: Self-pay

## 2020-11-23 ENCOUNTER — Other Ambulatory Visit: Payer: Self-pay

## 2020-11-29 ENCOUNTER — Ambulatory Visit (HOSPITAL_BASED_OUTPATIENT_CLINIC_OR_DEPARTMENT_OTHER): Payer: Self-pay | Admitting: Cardiovascular Disease

## 2020-11-29 NOTE — Progress Notes (Incomplete)
Virtual Visit via Video Note   This visit type was conducted due to national recommendations for restrictions regarding the COVID-19 Pandemic (e.g. social distancing) in an effort to limit this patient's exposure and mitigate transmission in our community.  Due to her co-morbid illnesses, this patient is at least at moderate risk for complications without adequate follow up.  This format is felt to be most appropriate for this patient at this time.  All issues noted in this document were discussed and addressed.  A limited physical exam was performed with this format.  Please refer to the patient's chart for her consent to telehealth for [facility].   Evaluation Performed:  Follow-up visit  Date:  11/29/2020   ID:  Andrea Barrett, DOB 1975-05-31, MRN [id number]  Patient Location: Home Provider Location: Office  PCP:  Patient, No Pcp Per (Inactive)  Cardiologist:  Chilton Si, MD  Electrophysiologist:  None   Chief Complaint:  Hypertension   History of Present Illness:    Andrea Barrett is a 45 y.o. female with chronic diastolic heart failure, hypercholesterolemia, hypothyroidism, and hypertension  here for follow-up.  She was initially seen 05/31/2018 for the evaluation of diastolic heart failure.  She was first diagnosed with hypertension in 2019.  Her BP was 220/110 at the time.  It has never been well-controlled.  She was hospitalized with diastolic heart failure for the first time 11/2017 and then 01/2018.  The first was at [facility] and then [facility]  She left [facility] with no medications because she didn't like how they were treating her.  At the time she has shortness of breath, LE edema, and orthopnea.  She also reported headache and blurry vision.  She was diuresed and started on antihypertensives.  Echo 01/2018 revealed LVEF 60-65% with moderate LVH and grade 1 diastolic dysfunction.  Renal artery dopplers at that time were negative for RAS.  She  now reports that she has been out of her medications for the last 2-3 weeks.  She reports that she was denies a refill on her medicaton by her PCP's office because she was coming to see cardiology, though there is no documentation of her calling.  She last saw [name], FNP, 02/2018 and her BP was 136/96 on her medications.    At her last appointment her blood pressure was poorly controlled.  However she has been out of her medications for quite some time so no adjustments were made.  She was encouraged to work on taking her medicines, diet, and exercise and asked to track her pressure.  Unfortunately she lost her job 06/2018 due to COVID-19.  Since then she has been under a lot of stress and is struggling to eat healthy meals.  She did get her blood pressure medications but still has not been able to fill her levothyroxine or potassium due to cost.  She is also trying to take care of her mother.  She does report some headaches lately and has been taking Goody powders.  Yesterday she lost her car and notes that her depression is poorly controlled.  Prior to that she had been off of her depression medications for 10 or 11 years.  However lately she is noting more depressive symptoms.  She has not experienced any chest pain or shortness of breath.  She has intermittent lower extremity edema that is worse when she does not take her Lasix.  However when she does take Lasix her fluids are well-controlled.  She is drinking 7 or 8 waters daily.  She has no orthopnea or PND.  She also complains of cracking toenails for the last 2 months which she wonders if it is attributable to her medications.   The patient does not have symptoms concerning for COVID-19 infection (fever, chills, cough, or new shortness of breath).    Past Medical History:  Diagnosis Date   Essential hypertension 05/31/2018   Hypertension    Hypothyroidism    Noncompliance 05/31/2018   Thyroid disease    Past Surgical History:   Procedure Laterality Date   CESAREAN SECTION     x 2   TUBAL LIGATION       No outpatient medications have been marked as taking for the 11/29/20 encounter (Appointment) with Chilton Si, MD.     Allergies:   Patient has no known allergies.   Social History   Tobacco Use   Smoking status: Never   Smokeless tobacco: Never  Vaping Use   Vaping Use: Never used  Substance Use Topics   Alcohol use: No   Drug use: No     Family Hx: The patient's family history includes Diabetes in her father; Hypertension in her father, maternal grandmother, and mother; Other in her mother; Stroke in her maternal grandmother.  ROS:   Please see the history of present illness.     All other systems reviewed and are negative.   Prior CV studies:   The following studies were reviewed today:  Echo 07/29/2017: Study Conclusions   - Left ventricle: The cavity size was normal. Wall thickness was   increased increased in a pattern of mild to moderate LVH.   Systolic function was normal. Wall motion was normal; there were   no regional wall motion abnormalities. - Left atrium: The atrium was mildly to moderately dilated.   Impressions:   - 1. Left ventricle systolic function is preserved visually   estimated at approximately 55 to 60%. Mild to moderate concentric   left ventricular hypertrophy.   2. Mild to moderate dilatation of the left atrium.  Labs/Other Tests and Data Reviewed:    EKG:  08/05/2018: EKG was not ordered.   An ECG dated 05/31/2018 was personally reviewed today and demonstrated:  Sinus rhythm.  Rate 62 bpm.  LVH.  Recent Labs: No results found for requested labs within last 8760 hours.   Recent Lipid Panel No results found for: CHOL, TRIG, HDL, CHOLHDL, LDLCALC, LDLDIRECT  Wt Readings from Last 3 Encounters:  11/23/19 300 lb (136.1 kg)  11/10/19 (!) 302 lb 12.8 oz (137.3 kg)  05/31/18 283 lb 12.8 oz (128.7 kg)     Objective:    There were no vitals taken  for this visit. GENERAL: Well-appearing.  No acute distress. HEENT: Pupils equal round.  Oral mucosa unremarkable NECK:  No jugular venous distention, no visible thyromegaly EXT:  No edema, no cyanosis no clubbing SKIN:  No rashes no nodules NEURO:  Speech fluent.  Cranial nerves grossly intact.  Moves all 4 extremities freely PSYCH:  Cognitively intact, oriented to person place and time   ASSESSMENT & PLAN:    # Hypertension:  Ms. Gherardi has been taking her medication.  However she does not have a blood pressure cuff so it is unclear what her blood pressure is actually running.  Therefore will not make any changes at this time.  Continue amlodipine, carvedilol, irbesartan, and Lasix.   # Chronic diastolic heart failure: This is 2/2 hypertension.  BP control  as above.    Volume status is improved with Lasix.   # Hypothyroidism: Patient has been unable to afford her levothyroxine prescription.  She will be picking this up from [facility].  Ultimately this will need to be followed with her PCP.   # Social stress: Ms. Dayley lost her job due to COVID-19.  She is struggling financially.  We will contact her social worker to see if we can assist with meals and getting her blood pressure machine.   COVID-19 Education: The signs and symptoms of COVID-19 were discussed with the patient and how to seek care for testing (follow up with PCP or arrange E-visit).  The importance of social distancing was discussed today.  Time:   Today, I have spent 23 minutes with the patient with telehealth technology discussing the above problems.     Medication Adjustments/Labs and Tests Ordered: Current medicines are reviewed at length with the patient today.  Concerns regarding medicines are outlined above.   Tests Ordered: No orders of the defined types were placed in this encounter.   Medication Changes: No orders of the defined types were placed in this encounter.   Disposition:  Follow up  with APP in 1 month.  Tiffany C. Duke Salvia, MD, [facility] in 6 months.  Guadalupe Maple  11/29/2020 7:25 AM    [facility]

## 2020-12-26 ENCOUNTER — Other Ambulatory Visit: Payer: Self-pay

## 2020-12-26 ENCOUNTER — Telehealth: Payer: Self-pay | Admitting: Cardiovascular Disease

## 2020-12-26 MED ORDER — LOSARTAN POTASSIUM 50 MG PO TABS
50.0000 mg | ORAL_TABLET | Freq: Every day | ORAL | 0 refills | Status: AC
  Filled 2020-12-26: qty 90, 90d supply, fill #0

## 2020-12-26 MED ORDER — AMLODIPINE BESYLATE 10 MG PO TABS: 10.0000 mg | ORAL_TABLET | Freq: Every day | ORAL | 0 refills | Status: AC

## 2020-12-26 MED ORDER — LOSARTAN POTASSIUM 50 MG PO TABS: 50.0000 mg | ORAL_TABLET | Freq: Every day | ORAL | 0 refills | Status: DC

## 2020-12-26 MED ORDER — CARVEDILOL 25 MG PO TABS
25.0000 mg | ORAL_TABLET | Freq: Two times a day (BID) | ORAL | 0 refills | Status: AC
  Filled 2020-12-26: qty 60, 30d supply, fill #0

## 2020-12-26 MED ORDER — CARVEDILOL 25 MG PO TABS: 25.0000 mg | ORAL_TABLET | Freq: Two times a day (BID) | ORAL | 0 refills | Status: DC

## 2020-12-26 NOTE — Telephone Encounter (Signed)
RX sent to pharmacy  

## 2020-12-26 NOTE — Telephone Encounter (Signed)
 *  STAT* If patient is at the pharmacy, call can be transferred to refill team.   1. Which medications need to be refilled? (please list name of each medication and dose if known)  carvedilol (COREG) 25 MG tablet losartan (COZAAR) 50 MG tablet amLODipine (NORVASC) 10 MG tablet  2. Which pharmacy/location (including street and city if local pharmacy) is medication to be sent to?[facility]  3. Do they need a 30 day or 90 day supply? 90 days  Pt made an appt with Dr. Duke Salvia on 02/05/21

## 2020-12-27 ENCOUNTER — Other Ambulatory Visit: Payer: Self-pay

## 2020-12-28 IMAGING — CR DG HIP (WITH OR WITHOUT PELVIS) 2-3V*R*
3 series · 3 of 3 positions shown · non-contrast
Comparison: None.

CLINICAL DATA: Right hip pain after fall

EXAM:
DG HIP (WITH OR WITHOUT PELVIS) 2-3V RIGHT

[t pelvis a.p.]
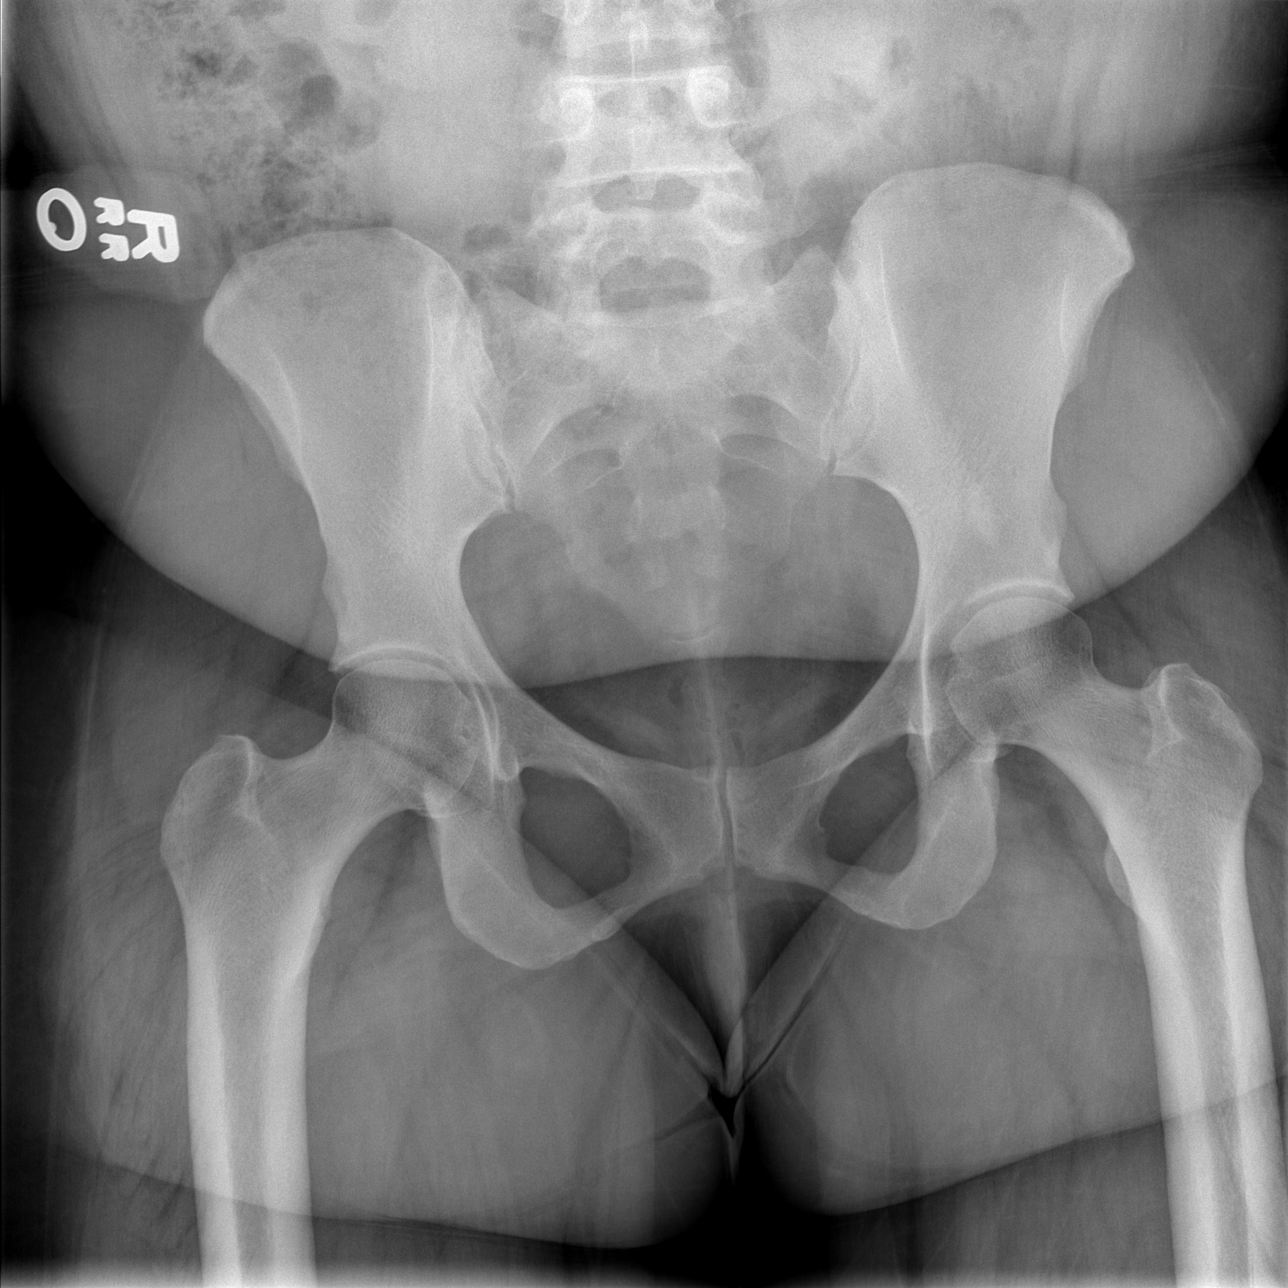

[t hip ap right]
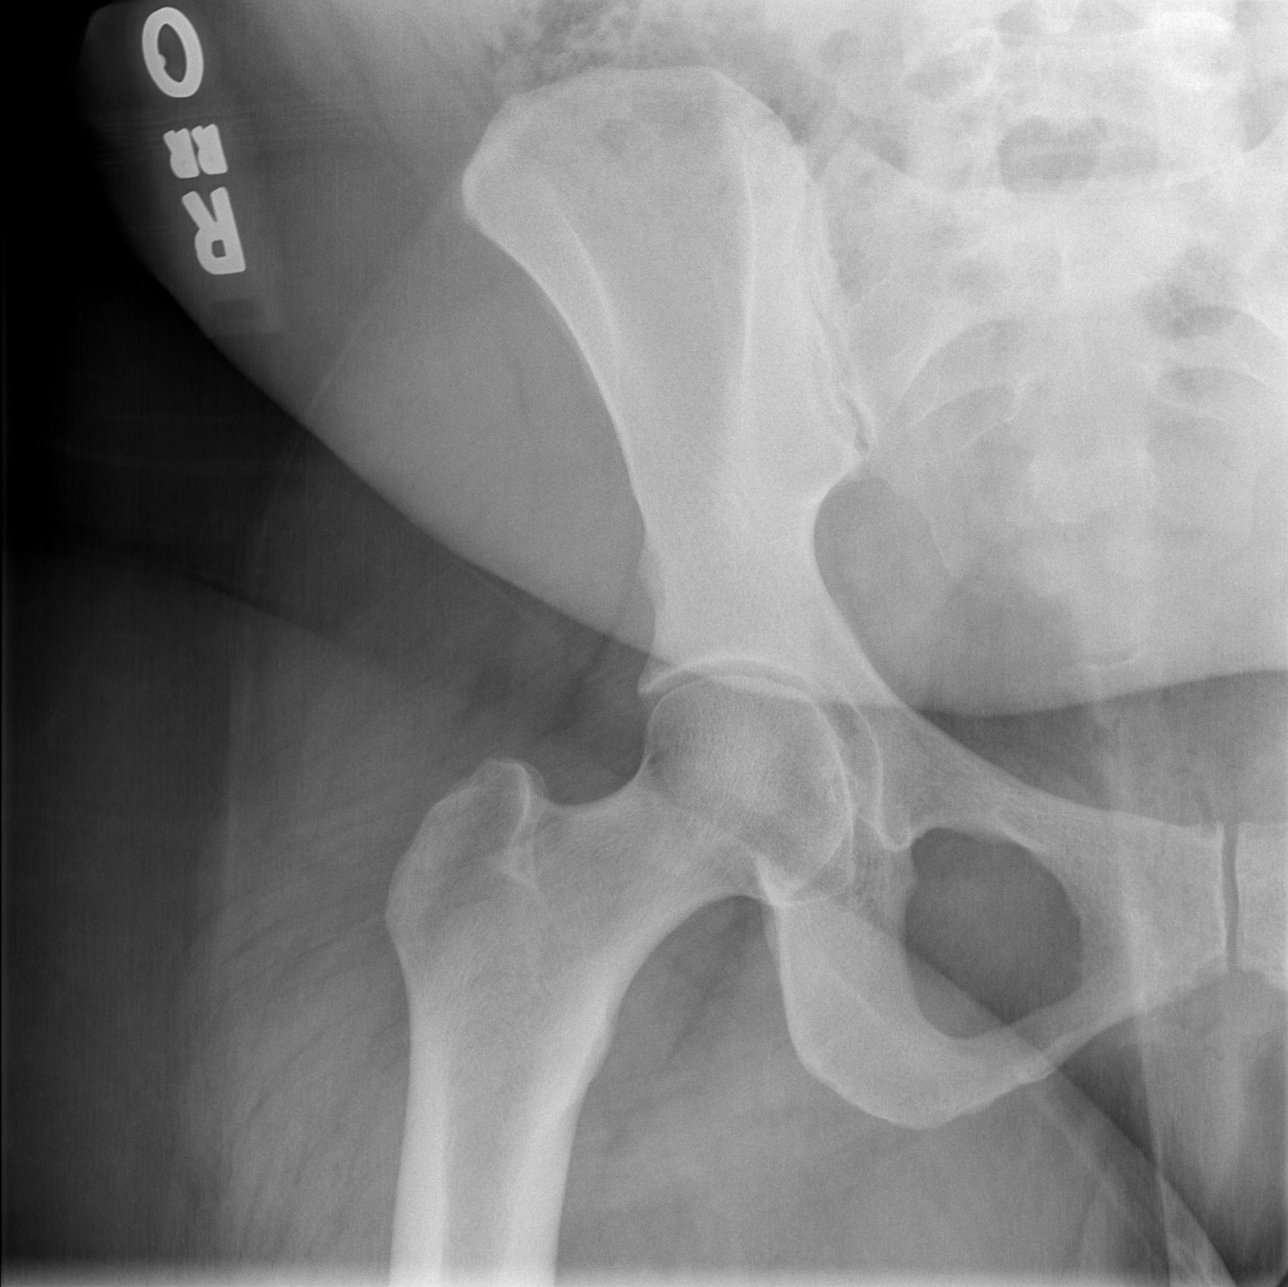

[t hip frog leg right]
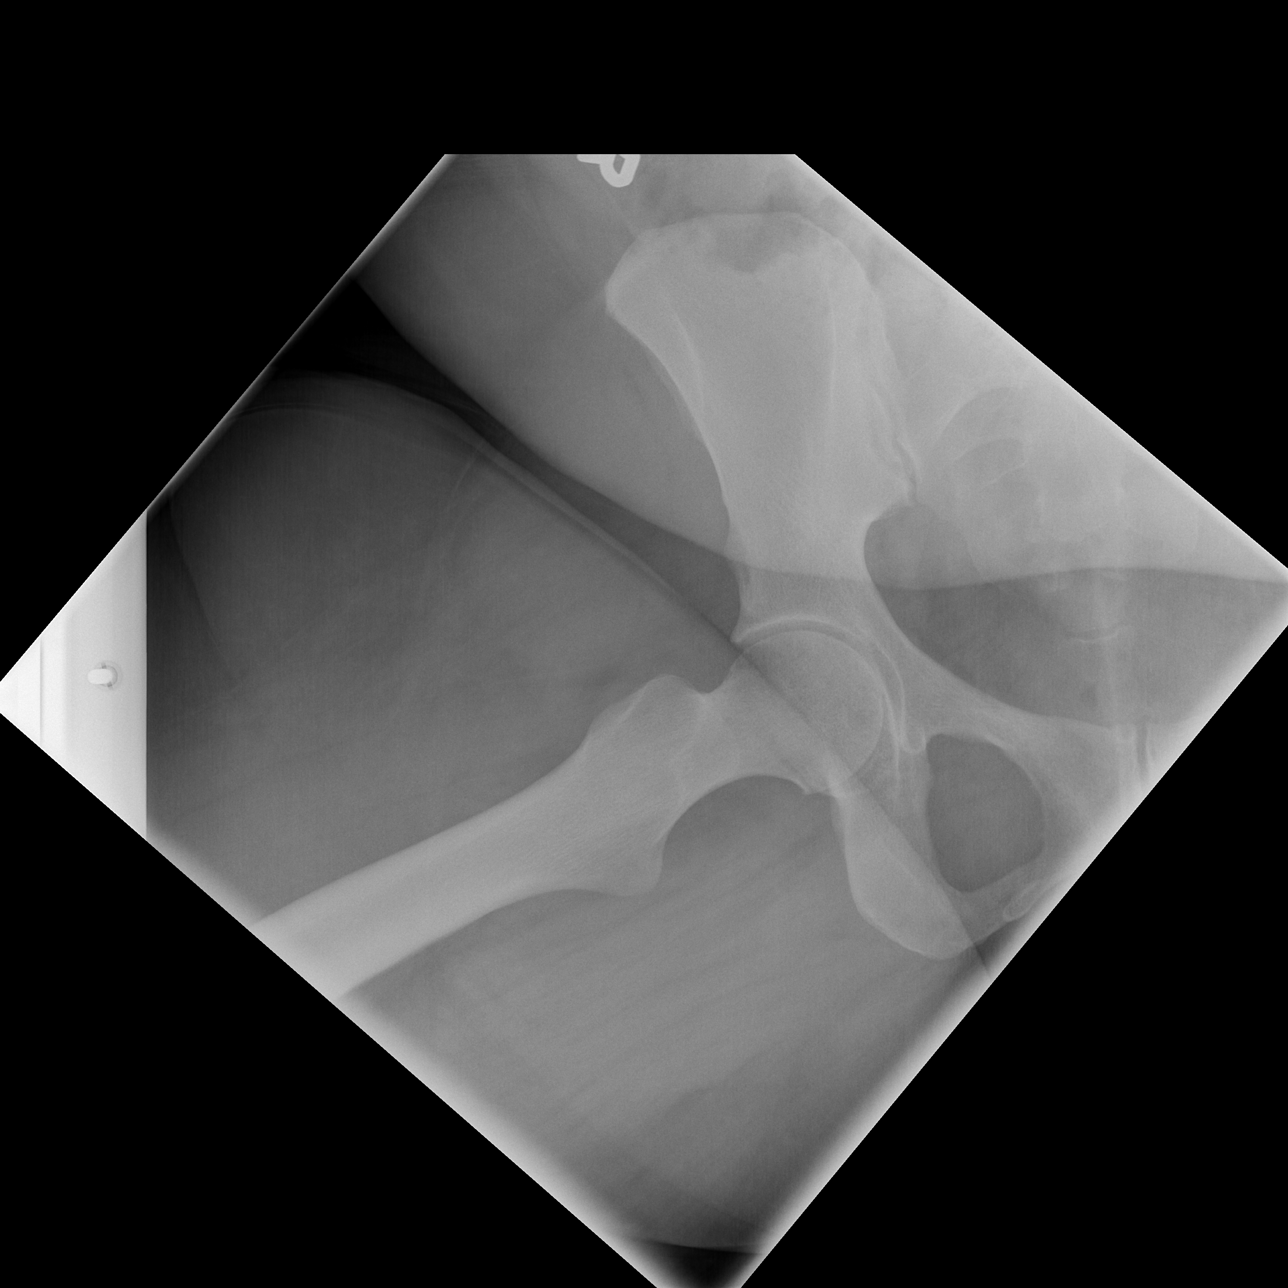

[3 of 3 positions shown; findings below may reference images not displayed]

FINDINGS: There is no evidence of hip fracture or dislocation. There is no
evidence of arthropathy or other focal bone abnormality.
IMPRESSION: Negative.

## 2021-02-05 ENCOUNTER — Ambulatory Visit (HOSPITAL_BASED_OUTPATIENT_CLINIC_OR_DEPARTMENT_OTHER): Payer: Self-pay | Admitting: Cardiovascular Disease

## 2021-02-05 NOTE — Progress Notes (Incomplete)
Virtual Visit via Video Note   This visit type was conducted due to national recommendations for restrictions regarding the COVID-19 Pandemic (e.g. social distancing) in an effort to limit this patient's exposure and mitigate transmission in our community.  Due to her co-morbid illnesses, this patient is at least at moderate risk for complications without adequate follow up.  This format is felt to be most appropriate for this patient at this time.  All issues noted in this document were discussed and addressed.  A limited physical exam was performed with this format.  Please refer to the patient's chart for her consent to telehealth for [facility].   Evaluation Performed:  Follow-up visit  Date:  02/05/2021   ID:  Andrea Barrett, DOB 04/15/1975, MRN [id number]  Patient Location: Home Provider Location: Office  PCP:  Patient, No Pcp Per (Inactive)  Cardiologist:  Andrea Si, MD  Electrophysiologist:  None   Chief Complaint:  Hypertension   History of Present Illness:    Andrea Barrett is a 45 y.o. female with chronic diastolic heart failure, hypercholesterolemia, hypothyroidism, and hypertension  here for follow-up.  She was initially seen 05/31/2018 for the evaluation of diastolic heart failure.  She was first diagnosed with hypertension in 2019.  Her BP was 220/110 at the time.  It has never been well-controlled.  She was hospitalized with diastolic heart failure for the first time 11/2017 and then 01/2018.  The first was at [facility] and then [facility].  She [facility] with no medications because she didn't like how they were treating her.  At the time she has shortness of breath, LE edema, and orthopnea.  She also reported headache and blurry vision.  She was diuresed and started on antihypertensives.  Echo 01/2018 revealed LVEF 60-65% with moderate LVH and grade 1 diastolic dysfunction.  Renal artery dopplers at that time were negative for RAS.  She  now reports that she has been out of her medications for the last 2-3 weeks.  She reports that she was denies a refill on her medicaton by her PCP's office because she was coming to see cardiology, though there is no documentation of her calling.  She last saw Andrea Barrett, 02/2018 and her BP was 136/96 on her medications.    When [name] seen in early 2020 she was out of her medicine and her blood pressure was poorly controlled. All her medicines were resumed, she did not have blood pressure cuff for her virtual visit. She saw Andrea Barrett 11/2019 and her blood pressure was 134/86. She had not been compliant with her thyroid treatment and was advised to find a new primary care to help manage this. She was seen in the ED 02/04/21 requesting a refill of her blood pressure medication.      Past Medical History:  Diagnosis Date   Essential hypertension 05/31/2018   Hypertension    Hypothyroidism    Noncompliance 05/31/2018   Thyroid disease    Past Surgical History:  Procedure Laterality Date   CESAREAN SECTION     x 2   TUBAL LIGATION       No outpatient medications have been marked as taking for the 02/05/21 encounter (Appointment) with Andrea Si, MD.     Allergies:   Patient has no known allergies.   Social History   Tobacco Use   Smoking status: Never   Smokeless tobacco: Never  Vaping Use   Vaping Use: Never  used  Substance Use Topics   Alcohol use: No   Drug use: No     Family Hx: The patient's family history includes Diabetes in her father; Hypertension in her father, maternal grandmother, and mother; Other in her mother; Stroke in her maternal grandmother.  ROS:   Please see the history of present illness.     All other systems reviewed and are negative.   Prior CV studies:   The following studies were reviewed today:  Echo 07/29/2017: Study Conclusions   - Left ventricle: The cavity size was normal. Wall thickness was   increased  increased in a pattern of mild to moderate LVH.   Systolic function was normal. Wall motion was normal; there were   no regional wall motion abnormalities. - Left atrium: The atrium was mildly to moderately dilated.   Impressions:   - 1. Left ventricle systolic function is preserved visually   estimated at approximately 55 to 60%. Mild to moderate concentric   left ventricular hypertrophy.   2. Mild to moderate dilatation of the left atrium.  Labs/Other Tests and Data Reviewed:    EKG:  An ECG dated 05/31/2018 was personally reviewed today and demonstrated:  Sinus rhythm.  Rate 62 bpm.  LVH.  Recent Labs: No results found for requested labs within last 8760 hours.   Recent Lipid Panel No results found for: CHOL, TRIG, HDL, CHOLHDL, LDLCALC, LDLDIRECT  Wt Readings from Last 3 Encounters:  11/23/19 300 lb (136.1 kg)  11/10/19 (!) 302 lb 12.8 oz (137.3 kg)  05/31/18 283 lb 12.8 oz (128.7 kg)     Objective:    There were no vitals taken for this visit. GENERAL: Well-appearing.  No acute distress. HEENT: Pupils equal round.  Oral mucosa unremarkable NECK:  No jugular venous distention, no visible thyromegaly EXT:  No edema, no cyanosis no clubbing SKIN:  No rashes no nodules NEURO:  Speech fluent.  Cranial nerves grossly intact.  Moves all 4 extremities freely PSYCH:  Cognitively intact, oriented to person place and time   ASSESSMENT & PLAN:    # Hypertension:  [name] has been taking her medication.  However she does not have a blood pressure cuff so it is unclear what her blood pressure is actually running.  Therefore will not make any changes at this time.  Continue amlodipine, carvedilol, irbesartan, and Lasix.   # Chronic diastolic heart failure: This is 2/2 hypertension.  BP control as above.    Volume status is improved with Lasix.   # Hypothyroidism: Patient has been unable to afford her levothyroxine prescription.  She will be picking this up from [facility].  Ultimately this will need to be followed with her PCP.   # Social stress: [name] lost her job due to COVID-19.  She is struggling financially.  We will contact her social worker to see if we can assist with meals and getting her blood pressure machine.   COVID-19 Education: The signs and symptoms of COVID-19 were discussed with the patient and how to seek care for testing (follow up with PCP or arrange E-visit).  The importance of social distancing was discussed today.  Time:   Today, I have spent 23 minutes with the patient with telehealth technology discussing the above problems.     Medication Adjustments/Labs and Tests Ordered: Current medicines are reviewed at length with the patient today.  Concerns regarding medicines are outlined above.   Tests Ordered: No orders of the defined types were  placed in this encounter.   Medication Changes: No orders of the defined types were placed in this encounter.   Disposition:  Follow up with APP in 1 month.  Tiffany C. Duke Salvia, MD, [facility] in 6 months.  Guadalupe Maple  02/05/2021 1:05 PM    [facility]

## 2022-05-23 ENCOUNTER — Emergency Department (HOSPITAL_BASED_OUTPATIENT_CLINIC_OR_DEPARTMENT_OTHER)
Admission: EM | Admit: 2022-05-23 | Discharge: 2022-05-23 | Disposition: A | Discharge status: Home / Self Care | Payer: Medicaid Other | Attending: Emergency Medicine | Admitting: Emergency Medicine

## 2022-05-23 ENCOUNTER — Other Ambulatory Visit: Payer: Self-pay

## 2022-05-23 ENCOUNTER — Encounter (HOSPITAL_BASED_OUTPATIENT_CLINIC_OR_DEPARTMENT_OTHER): Payer: Self-pay

## 2022-05-23 DIAGNOSIS — I10 Essential (primary) hypertension: Secondary | ICD-10-CM | POA: Diagnosis not present

## 2022-05-23 DIAGNOSIS — Z79899 Other long term (current) drug therapy: Secondary | ICD-10-CM | POA: Diagnosis not present

## 2022-05-23 DIAGNOSIS — R110 Nausea: Secondary | ICD-10-CM | POA: Diagnosis present

## 2022-05-23 DIAGNOSIS — R109 Unspecified abdominal pain: Secondary | ICD-10-CM | POA: Insufficient documentation

## 2022-05-23 HISTORY — DX: Heart failure, unspecified: I50.9

## 2022-05-23 MED ORDER — ONDANSETRON 4 MG PO TBDP
8.0000 mg | ORAL_TABLET | Freq: Once | ORAL | Status: AC
  Administered 2022-05-23: 8 mg via ORAL

## 2022-05-23 MED ORDER — ONDANSETRON 4 MG PO TBDP
8.0000 mg | ORAL_TABLET | Freq: Once | ORAL | Status: DC
  Filled 2022-05-23: qty 2

## 2022-05-23 NOTE — ED Triage Notes (Signed)
States having hight blood pressure and sent by MD.  States on BP meds but did not take this am.  Has tooth infection left upper molar being treated with antibiotics.  Sates also having abdomen discomfort with some diarrhea since on the antibiotics

## 2022-05-23 NOTE — ED Notes (Signed)
Discharge paperwork given and verbally understood. 

## 2022-05-23 NOTE — ED Provider Notes (Signed)
[facility] Provider Note   CSN: [id number] Arrival date & time: 05/23/22  0746     History  Chief Complaint  Patient presents with   Hypertension    Bradley Renet Mack is a 47 y.o. female.  47 year old female presents with nausea and tooth pain.  Seen recently for same.  Also sent about hypertension although patient is chronically hypertensive.  She denies any fever, emesis, chest pain, diarrhea.  States that she has been using NSAIDs which has irritated her stomach.  Was at work today and complained of having abdominal discomfort was told to come to be checked out.       Home Medications Prior to Admission medications   Medication Sig Start Date End Date Taking? Authorizing Provider  amLODipine (NORVASC) 10 MG tablet Take 1 tablet (10 mg total) by mouth daily. 12/26/20   Skeet Latch, MD  amLODipine (NORVASC) 10 MG tablet Take 1 tablet (10 mg total) by mouth daily. (Must Schedule Appointment for Future Refills) 12/26/20   Skeet Latch, MD  carvedilol (COREG) 25 MG tablet Take 1 tablet (25 mg total) by mouth 2 (two) times daily with a meal. (Must Schedule Appointment for Future Refills) 11/13/20   Skeet Latch, MD  carvedilol (COREG) 25 MG tablet Take 1 tablet (25 mg total) by mouth 2 (two) times daily with a meal. 12/26/20   Skeet Latch, MD  dexamethasone (DECADRON) 4 MG tablet Take 1 tablet (4 mg total) by mouth 2 (two) times daily. 11/23/19   Virgel Manifold, MD  ferrous sulfate (FERROUSUL) 325 (65 FE) MG tablet Take 1 tablet (325 mg total) by mouth 3 (three) times daily with meals. ADDITIONAL REFILLS FROM PRIMARY CARE 11/10/19 12/10/19  Erlene Quan, PA-C  furosemide (LASIX) 40 MG tablet Take 1 tablet (40 mg total) by mouth 2 (two) times daily. Take 1 potassium tablet with lasix dose. 11/10/19   Erlene Quan, PA-C  gabapentin (NEURONTIN) 300 MG capsule Take 1 capsule (300 mg total) by mouth 3 (three) times daily. 11/23/19    Virgel Manifold, MD  levothyroxine (SYNTHROID) 25 MCG tablet Take 1 tablet (25 mcg total) by mouth daily before breakfast. Please follow-up with a primary care provider. 11/10/19   Erlene Quan, PA-C  losartan (COZAAR) 50 MG tablet Take 1 tablet (50 mg total) by mouth daily. 11/10/19 12/10/19  Erlene Quan, PA-C  losartan (COZAAR) 50 MG tablet Take 1 tablet (50 mg total) by mouth daily. (Must Schedule Appointment for Future Refills) 12/26/20   Skeet Latch, MD  potassium chloride SA (KLOR-CON) 20 MEQ tablet Take 1 tablet (20 mEq total) by mouth daily. 11/10/19   Erlene Quan, PA-C      Allergies    Patient has no known allergies.    Review of Systems   Review of Systems  All other systems reviewed and are negative.   Physical Exam Updated Vital Signs BP 139/82 (BP Location: Right Arm)   Pulse (!) 57   Temp 98.8 F (37.1 C) (Oral)   Resp 16   LMP 05/20/2022 (Exact Date)   SpO2 99%  Physical Exam Vitals and nursing note reviewed.  Constitutional:      General: She is not in acute distress.    Appearance: Normal appearance. She is well-developed. She is not toxic-appearing.  HENT:     Head: Normocephalic and atraumatic.     Mouth/Throat:     Comments: No intraoral abscess identified.  Patient's left posterior upper molar  shows tooth fracture Eyes:     General: Lids are normal.     Conjunctiva/sclera: Conjunctivae normal.     Pupils: Pupils are equal, round, and reactive to light.  Neck:     Thyroid: No thyroid mass.     Trachea: No tracheal deviation.  Cardiovascular:     Rate and Rhythm: Normal rate and regular rhythm.     Heart sounds: Normal heart sounds. No murmur heard.    No gallop.  Pulmonary:     Effort: Pulmonary effort is normal. No respiratory distress.     Breath sounds: Normal breath sounds. No stridor. No decreased breath sounds, wheezing, rhonchi or rales.  Abdominal:     General: There is no distension.     Palpations: Abdomen is soft.     Tenderness:  There is no abdominal tenderness. There is no rebound.  Musculoskeletal:        General: No tenderness. Normal range of motion.     Cervical back: Normal range of motion and neck supple.  Skin:    General: Skin is warm and dry.     Findings: No abrasion or rash.  Neurological:     Mental Status: She is alert and oriented to person, place, and time. Mental status is at baseline.     GCS: GCS eye subscore is 4. GCS verbal subscore is 5. GCS motor subscore is 6.     Cranial Nerves: No cranial nerve deficit.     Sensory: No sensory deficit.     Motor: Motor function is intact.  Psychiatric:        Attention and Perception: Attention normal.        Speech: Speech normal.        Behavior: Behavior normal.     ED Results / Procedures / Treatments   Labs (all labs ordered are listed, but only abnormal results are displayed) Labs Reviewed - No data to display  EKG None  Radiology No results found.  Procedures Procedures    Medications Ordered in ED Medications  ondansetron (ZOFRAN-ODT) disintegrating tablet 8 mg (has no administration in time range)    ED Course/ Medical Decision Making/ A&P                             Medical Decision Making Risk Prescription drug management.   Patient blood pressure noted here.  In the room when he was taken by myself it was 139/80.  No indication for acute intervention.  Do not feel that she has hypertensive crisis.  Patient also states that she did not take her blood pressure medication this morning.  Informed her that she needs to do this.  Will give her Zofran here.  Informed to take her medications on a full stomach.  She has dental appointment scheduled in 2 weeks        Final Clinical Impression(s) / ED Diagnoses Final diagnoses:  None    Rx / DC Orders ED Discharge Orders     None         Lacretia Leigh, MD 05/23/22 [id number]

## 2022-07-03 ENCOUNTER — Encounter (HOSPITAL_BASED_OUTPATIENT_CLINIC_OR_DEPARTMENT_OTHER): Payer: Self-pay

## 2022-07-03 ENCOUNTER — Other Ambulatory Visit (HOSPITAL_BASED_OUTPATIENT_CLINIC_OR_DEPARTMENT_OTHER): Payer: Self-pay

## 2022-07-03 ENCOUNTER — Emergency Department (HOSPITAL_BASED_OUTPATIENT_CLINIC_OR_DEPARTMENT_OTHER)
Admission: EM | Admit: 2022-07-03 | Discharge: 2022-07-03 | Disposition: A | Discharge status: Home / Self Care | Payer: Medicaid Other | Attending: Emergency Medicine | Admitting: Emergency Medicine

## 2022-07-03 DIAGNOSIS — K0889 Other specified disorders of teeth and supporting structures: Secondary | ICD-10-CM | POA: Diagnosis not present

## 2022-07-03 DIAGNOSIS — I509 Heart failure, unspecified: Secondary | ICD-10-CM | POA: Insufficient documentation

## 2022-07-03 DIAGNOSIS — E039 Hypothyroidism, unspecified: Secondary | ICD-10-CM | POA: Diagnosis not present

## 2022-07-03 DIAGNOSIS — Z79899 Other long term (current) drug therapy: Secondary | ICD-10-CM | POA: Insufficient documentation

## 2022-07-03 DIAGNOSIS — I110 Hypertensive heart disease with heart failure: Secondary | ICD-10-CM | POA: Insufficient documentation

## 2022-07-03 MED ORDER — OXYCODONE HCL 5 MG PO TABS
5.0000 mg | ORAL_TABLET | Freq: Four times a day (QID) | ORAL | 0 refills | Status: AC | PRN
  Filled 2022-07-03: qty 5, 2d supply, fill #0

## 2022-07-03 MED ORDER — OXYCODONE-ACETAMINOPHEN 5-325 MG PO TABS
1.0000 | ORAL_TABLET | Freq: Once | ORAL | Status: AC
  Administered 2022-07-03: 1 via ORAL
  Filled 2022-07-03: qty 1

## 2022-07-03 MED ORDER — IBUPROFEN 600 MG PO TABS
600.0000 mg | ORAL_TABLET | Freq: Four times a day (QID) | ORAL | 0 refills | Status: AC | PRN
  Filled 2022-07-03: qty 30, 8d supply, fill #0

## 2022-07-03 MED ORDER — AMOXICILLIN-POT CLAVULANATE 875-125 MG PO TABS
1.0000 | ORAL_TABLET | Freq: Two times a day (BID) | ORAL | 0 refills | Status: AC
  Filled 2022-07-03: qty 28, 14d supply, fill #0

## 2022-07-03 NOTE — ED Notes (Signed)
Discharge instructions reviewed with patient. Patient verbalizes understanding, no further questions at this time. Medications/prescriptions and follow up information provided. No acute distress noted at time of departure.  

## 2022-07-03 NOTE — ED Triage Notes (Signed)
C/o left upper dental pain. States goes to dentist April 8th. States was taking antibiotics for 3 weeks but is out now x 2 weeks. C/o continued pain. Supposed to get tooth pulled from oral surgeon.

## 2022-07-03 NOTE — Discharge Instructions (Addendum)
The visit emergency department today was overall reassuring.  As discussed, we will send antibiotics in the form of Augmentin to take twice daily.  Will also send in short course of pain medicine to take as needed for breakthrough pain.  Continue take Tylenol/Motrin as needed for baseline pain.  Keep upcoming appointment with oral specialist to have affected tooth addressed.  Please do not hesitate to return to emergency department for worrisome signs and symptoms we discussed become apparent.

## 2022-07-03 NOTE — ED Provider Notes (Signed)
[facility] AT [facility] [place] Provider Note   CSN: [id number] Arrival date & time: 07/03/22  1320     History  Chief Complaint  Patient presents with   Dental Pain    Andrea Barrett is a 47 y.o. female.   Dental Pain   47 year old female presents emergency department with complaints of dental pain.  Patient states that she has been dealing with dental pain for approximately 2 months now.  Has been seen 2 times for it and been prescribed penicillin VK which she states has improved her symptoms temporarily before they return.  Patient has been off antibiotics now for 1 week with return of symptoms.  Reports pain on left upper molar.  Notes some external swelling in affected area.  States that she has had pain with chewing but no difficulty swallowing/breathing.  Denies fever.  Patient has upcoming appointment on April 8 to have affected tooth addressed by oral surgeon.  States he has been taking at home Tylenol/ibuprofen with some relief of symptoms.  Past medical history significant for CHF, hypothyroidism, hypertension, iron deficiency anemia  Home Medications Prior to Admission medications   Medication Sig Start Date End Date Taking? Authorizing Provider  amoxicillin-clavulanate (AUGMENTIN) 875-125 MG tablet Take 1 tablet by mouth every 12 (twelve) hours for 14 days. 07/03/22 07/17/22 Yes Dion Saucier A, PA  ibuprofen (ADVIL) 600 MG tablet Take 1 tablet (600 mg total) by mouth every 6 (six) hours as needed. 07/03/22  Yes Dion Saucier A, PA  oxyCODONE (ROXICODONE) 5 MG immediate release tablet Take 1 tablet (5 mg total) by mouth every 6 (six) hours as needed for severe pain or breakthrough pain. 07/03/22  Yes Dion Saucier A, PA  amLODipine (NORVASC) 10 MG tablet Take 1 tablet (10 mg total) by mouth daily. 12/26/20   Skeet Latch, MD  amLODipine (NORVASC) 10 MG tablet Take 1 tablet (10 mg total) by mouth daily. (Must Schedule Appointment for  Future Refills) 12/26/20   Skeet Latch, MD  carvedilol (COREG) 25 MG tablet Take 1 tablet (25 mg total) by mouth 2 (two) times daily with a meal. (Must Schedule Appointment for Future Refills) 11/13/20   Skeet Latch, MD  carvedilol (COREG) 25 MG tablet Take 1 tablet (25 mg total) by mouth 2 (two) times daily with a meal. 12/26/20   Skeet Latch, MD  dexamethasone (DECADRON) 4 MG tablet Take 1 tablet (4 mg total) by mouth 2 (two) times daily. 11/23/19   Virgel Manifold, MD  ferrous sulfate (FERROUSUL) 325 (65 FE) MG tablet Take 1 tablet (325 mg total) by mouth 3 (three) times daily with meals. ADDITIONAL REFILLS FROM PRIMARY CARE 11/10/19 12/10/19  Erlene Quan, PA-C  furosemide (LASIX) 40 MG tablet Take 1 tablet (40 mg total) by mouth 2 (two) times daily. Take 1 potassium tablet with lasix dose. 11/10/19   Erlene Quan, PA-C  gabapentin (NEURONTIN) 300 MG capsule Take 1 capsule (300 mg total) by mouth 3 (three) times daily. 11/23/19   Virgel Manifold, MD  levothyroxine (SYNTHROID) 25 MCG tablet Take 1 tablet (25 mcg total) by mouth daily before breakfast. Please follow-up with a primary care provider. 11/10/19   Erlene Quan, PA-C  losartan (COZAAR) 50 MG tablet Take 1 tablet (50 mg total) by mouth daily. 11/10/19 12/10/19  Erlene Quan, PA-C  losartan (COZAAR) 50 MG tablet Take 1 tablet (50 mg total) by mouth daily. (Must Schedule Appointment for Future Refills) 12/26/20   Skeet Latch, MD  potassium chloride SA (KLOR-CON) 20 MEQ tablet Take 1 tablet (20 mEq total) by mouth daily. 11/10/19   Erlene Quan, PA-C      Allergies    Patient has no known allergies.    Review of Systems   Review of Systems  All other systems reviewed and are negative.   Physical Exam Updated Vital Signs BP (!) 165/106 (BP Location: Left Arm)   Pulse 76   Temp 97.8 F (36.6 C) (Oral)   Resp 20   Ht 5\' 7"  (1.702 m)   Wt 136.1 kg   LMP 06/06/2022 (Approximate)   SpO2 99%   BMI 46.99 kg/m  Physical  Exam Vitals and nursing note reviewed.  Constitutional:      General: She is not in acute distress.    Appearance: She is well-developed.  HENT:     Head: Normocephalic and atraumatic.     Mouth/Throat:      Comments: Patient with fractured tooth indicated above with appreciable cavity.  Tender to palpation along gumline without evidence of periapical abscess.  Mild external swelling.  No trismus appreciated.  No changes in phonation per patient.  Uvula midline rise symmetric with phonation.  No posterior pharyngeal erythema.  Tonsils 0-1+ bilaterally with no obvious exudate.  No sublingual or submandibular swelling appreciated. Eyes:     Conjunctiva/sclera: Conjunctivae normal.  Cardiovascular:     Rate and Rhythm: Normal rate and regular rhythm.     Heart sounds: No murmur heard. Pulmonary:     Effort: Pulmonary effort is normal. No respiratory distress.     Breath sounds: Normal breath sounds.  Abdominal:     Palpations: Abdomen is soft.     Tenderness: There is no abdominal tenderness.  Musculoskeletal:        General: No swelling.     Cervical back: Neck supple.  Skin:    General: Skin is warm and dry.     Capillary Refill: Capillary refill takes less than 2 seconds.  Neurological:     Mental Status: She is alert.  Psychiatric:        Mood and Affect: Mood normal.     ED Results / Procedures / Treatments   Labs (all labs ordered are listed, but only abnormal results are displayed) Labs Reviewed - No data to display  EKG None  Radiology No results found.  Procedures Procedures    Medications Ordered in ED Medications  oxyCODONE-acetaminophen (PERCOCET/ROXICET) 5-325 MG per tablet 1 tablet (1 tablet Oral Given 07/03/22 1421)    ED Course/ Medical Decision Making/ A&P                             Medical Decision Making  This patient presents to the ED for concern of dental pain, this involves an extensive number of treatment options, and is a complaint  that carries with it a high risk of complications and morbidity.  The differential diagnosis includes caries, cellulitis, erysipelas, periapical abscess, Ludwig angina, peritonsillar abscess, necrotizing ulcerative gingivitis, angioedema, anaphylaxis, Lemierre's disease   Co morbidities that complicate the patient evaluation  See HPI   Additional history obtained:  Additional history obtained from EMR External records from outside source obtained and reviewed including hospital records   Lab Tests:  N/a   Imaging Studies ordered:  N/a   Cardiac Monitoring: / EKG:  The patient was maintained on a cardiac monitor.  I personally viewed and interpreted the cardiac monitored  which showed an underlying rhythm of: Sinus rhythm   Consultations Obtained:  N/a   Problem List / ED Course / Critical interventions / Medication management  Dental pain I ordered medication including oxycodone   Reevaluation of the patient after these medicines showed that the patient improved I have reviewed the patients home medicines and have made adjustments as needed   Social Determinants of Health:  Denies tobacco, illicit drug use.   Test / Admission - Considered:  Dental pain Vitals signs significant for hypertension with blood pressure 165/106. Otherwise within normal range and stable throughout visit. 47 year old female presents with dental pain.  No evidence of Ludwig angina.  Patient overall well-appearing, tolerating oral secretions without difficulty, afebrile in no acute distress.  Patient tolerated p.o. without difficulty while in the ED.  Will treat outpatient with oral Augmentin given recurrence of symptoms with penicillin VK until follow-up with oral specialist on April 8.  Patient recommended continued use of pain medication at home in the form of Tylenol/Motrin.  Treatment plan discussed at length with patient and she acknowledged understanding was agreeable to said  plan. Worrisome signs and symptoms were discussed with the patient, and the patient acknowledged understanding to return to the ED if noticed. Patient was stable upon discharge.          Final Clinical Impression(s) / ED Diagnoses Final diagnoses:  Pain, dental    Rx / DC Orders ED Discharge Orders          Ordered    amoxicillin-clavulanate (AUGMENTIN) 875-125 MG tablet  Every 12 hours        07/03/22 1428    oxyCODONE (ROXICODONE) 5 MG immediate release tablet  Every 6 hours PRN        07/03/22 1428    ibuprofen (ADVIL) 600 MG tablet  Every 6 hours PRN        07/03/22 1428              Wilnette Kales, [place] 07/03/22 1429    Fredia Sorrow, MD 07/04/22 [phone]

## 2024-05-10 ENCOUNTER — Encounter (HOSPITAL_BASED_OUTPATIENT_CLINIC_OR_DEPARTMENT_OTHER): Payer: Self-pay

## 2024-05-10 ENCOUNTER — Telehealth (HOSPITAL_BASED_OUTPATIENT_CLINIC_OR_DEPARTMENT_OTHER): Payer: Self-pay

## 2024-05-10 NOTE — Progress Notes (Incomplete)
" °  Cardiology Office Note:  .   Date:  05/10/2024  ID:  Andrea Barrett, DOB July 25, 1975, MRN [id number] PCP: System, Provider Not In  [facility] Providers Cardiologist:  Annabella Scarce, MD {  History of Present Illness: Andrea Barrett is a 49 y.o. female ***  Referral from 04/26/24 reviewed. No records included with referral.  ROS: Denies chest pain, shortness of breath at rest or with normal exertion. No PND, orthopnea, LE edema or unexpected weight gain. No syncope or palpitations. ROS otherwise negative except as noted.   Studies Reviewed: Andrea    EKG:       Physical Exam:   VS:  There were no vitals taken for this visit.   Wt Readings from Last 3 Encounters:  07/03/22 300 lb (136.1 kg)  11/23/19 300 lb (136.1 kg)  11/10/19 (!) 302 lb 12.8 oz (137.3 kg)    GEN: Well nourished, well developed in no acute distress HEENT: Normal, moist mucous membranes NECK: No JVD CARDIAC: regular rhythm, normal S1 and S2, no rubs or gallops. No murmur. VASCULAR: Radial and DP pulses 2+ bilaterally. No carotid bruits RESPIRATORY:  Clear to auscultation without rales, wheezing or rhonchi  ABDOMEN: Soft, non-tender, non-distended MUSCULOSKELETAL:  Ambulates independently SKIN: Warm and dry, no edema NEUROLOGIC:  Alert and oriented x 3. No focal neuro deficits noted. PSYCHIATRIC:  Normal affect    ASSESSMENT AND PLAN: .     CV risk counseling and prevention -recommend heart healthy/Mediterranean diet, with whole grains, fruits, vegetable, fish, lean meats, nuts, and olive oil. Limit salt. -recommend moderate walking, 3-5 times/week for 30-50 minutes each session. Aim for at least 150 minutes/week. Goal should be pace of 3 miles/hours, or walking 1.5 miles in 30 minutes -recommend avoidance of tobacco products. Avoid excess alcohol. -ASCVD risk score: The 10-year ASCVD risk score (Arnett DK, et al., 2019) is: 8%   Values used to calculate the score:     Age: 30 years      Clinically relevant sex: Female     Is Non-Hispanic African American: Yes     Diabetic: No     Tobacco smoker: No     Systolic Blood Pressure: 146 mmHg     Is BP treated: Yes     HDL Cholesterol: 46 mg/dL     Total Cholesterol: 237 mg/dL    Dispo: ***  Signed, Shelda Bruckner, MD   Shelda Bruckner, MD, PhD, [facility] [place]  [facility]  [place]  [facility] at [facility] at [facility] [address], [address] [place], [place] [address] [phone]   "

## 2024-05-10 NOTE — Telephone Encounter (Signed)
 Attempted to find more information for patient's visit tomorrow. Attempted to call pt 3 times. There was no VM to leave a message. I sent pt a mychart message asking her a few question to clarify and or to give us  a call back.   Based off some notes from her internal medicine office ([facility]) she was being followed by [facility] by Dr. Jeronimo. Attempted to call the [facility] office but the phones were down.  Attempted to call [facility] but there was no answer. Left a vm for them to give a call back.   In her note from [facility] states she has a diagnosis of Chronic diastolic CHF and essential HTN, and was screened for lipid disorders.

## 2024-05-11 ENCOUNTER — Ambulatory Visit (HOSPITAL_BASED_OUTPATIENT_CLINIC_OR_DEPARTMENT_OTHER): Payer: MEDICAID | Admitting: Cardiology
